# Patient Record
Sex: Male | Born: 1960 | Race: Black or African American | Hispanic: No | Marital: Single | Smoking: Never smoker
Health system: Southern US, Community
[De-identification: ages and names within clinical notes are randomized; demographics above are authoritative.]

## PROBLEM LIST (undated history)

## (undated) DIAGNOSIS — N189 Chronic kidney disease, unspecified: Secondary | ICD-10-CM

## (undated) DIAGNOSIS — I509 Heart failure, unspecified: Secondary | ICD-10-CM

## (undated) DIAGNOSIS — I10 Essential (primary) hypertension: Secondary | ICD-10-CM

## (undated) DIAGNOSIS — I639 Cerebral infarction, unspecified: Secondary | ICD-10-CM

## (undated) HISTORY — PX: CARDIAC DEFIBRILLATOR PLACEMENT: SHX171

---

## 2006-12-23 HISTORY — PX: ANKLE SURGERY: SHX546

## 2007-06-22 ENCOUNTER — Emergency Department (HOSPITAL_COMMUNITY): Admission: EM | Admit: 2007-06-22 | Discharge: 2007-06-22 | Payer: Self-pay | Admitting: Emergency Medicine

## 2015-12-02 ENCOUNTER — Encounter (HOSPITAL_BASED_OUTPATIENT_CLINIC_OR_DEPARTMENT_OTHER): Payer: Self-pay | Admitting: *Deleted

## 2015-12-02 ENCOUNTER — Emergency Department (HOSPITAL_BASED_OUTPATIENT_CLINIC_OR_DEPARTMENT_OTHER): Payer: Non-veteran care

## 2015-12-02 ENCOUNTER — Inpatient Hospital Stay (HOSPITAL_BASED_OUTPATIENT_CLINIC_OR_DEPARTMENT_OTHER)
Admission: EM | Admit: 2015-12-02 | Discharge: 2015-12-07 | DRG: 291 | Disposition: A | Discharge status: Home / Self Care | Payer: Non-veteran care | Attending: Internal Medicine | Admitting: Internal Medicine

## 2015-12-02 DIAGNOSIS — I130 Hypertensive heart and chronic kidney disease with heart failure and stage 1 through stage 4 chronic kidney disease, or unspecified chronic kidney disease: Secondary | ICD-10-CM | POA: Diagnosis not present

## 2015-12-02 DIAGNOSIS — I5043 Acute on chronic combined systolic (congestive) and diastolic (congestive) heart failure: Secondary | ICD-10-CM | POA: Diagnosis present

## 2015-12-02 DIAGNOSIS — N184 Chronic kidney disease, stage 4 (severe): Secondary | ICD-10-CM | POA: Diagnosis present

## 2015-12-02 DIAGNOSIS — E876 Hypokalemia: Secondary | ICD-10-CM | POA: Diagnosis present

## 2015-12-02 DIAGNOSIS — Z841 Family history of disorders of kidney and ureter: Secondary | ICD-10-CM

## 2015-12-02 DIAGNOSIS — R0600 Dyspnea, unspecified: Secondary | ICD-10-CM | POA: Insufficient documentation

## 2015-12-02 DIAGNOSIS — I10 Essential (primary) hypertension: Secondary | ICD-10-CM | POA: Diagnosis present

## 2015-12-02 DIAGNOSIS — I5021 Acute systolic (congestive) heart failure: Secondary | ICD-10-CM | POA: Diagnosis present

## 2015-12-02 DIAGNOSIS — N179 Acute kidney failure, unspecified: Secondary | ICD-10-CM | POA: Diagnosis present

## 2015-12-02 DIAGNOSIS — N19 Unspecified kidney failure: Secondary | ICD-10-CM

## 2015-12-02 DIAGNOSIS — D631 Anemia in chronic kidney disease: Secondary | ICD-10-CM | POA: Diagnosis present

## 2015-12-02 DIAGNOSIS — Z8249 Family history of ischemic heart disease and other diseases of the circulatory system: Secondary | ICD-10-CM

## 2015-12-02 DIAGNOSIS — I471 Supraventricular tachycardia: Secondary | ICD-10-CM | POA: Diagnosis present

## 2015-12-02 DIAGNOSIS — I509 Heart failure, unspecified: Secondary | ICD-10-CM

## 2015-12-02 DIAGNOSIS — I248 Other forms of acute ischemic heart disease: Secondary | ICD-10-CM | POA: Diagnosis present

## 2015-12-02 DIAGNOSIS — N189 Chronic kidney disease, unspecified: Secondary | ICD-10-CM

## 2015-12-02 DIAGNOSIS — Z8673 Personal history of transient ischemic attack (TIA), and cerebral infarction without residual deficits: Secondary | ICD-10-CM

## 2015-12-02 HISTORY — DX: Chronic kidney disease, unspecified: N18.9

## 2015-12-02 HISTORY — DX: Essential (primary) hypertension: I10

## 2015-12-02 HISTORY — DX: Heart failure, unspecified: I50.9

## 2015-12-02 HISTORY — DX: Cerebral infarction, unspecified: I63.9

## 2015-12-02 LAB — CBC WITH DIFFERENTIAL/PLATELET
BASOS PCT: 0 %
Basophils Absolute: 0 10*3/uL (ref 0.0–0.1)
EOS ABS: 0.1 10*3/uL (ref 0.0–0.7)
Eosinophils Relative: 2 %
HCT: 37.8 % — ABNORMAL LOW (ref 39.0–52.0)
Hemoglobin: 12.5 g/dL — ABNORMAL LOW (ref 13.0–17.0)
LYMPHS ABS: 2.3 10*3/uL (ref 0.7–4.0)
Lymphocytes Relative: 28 %
MCH: 27.8 pg (ref 26.0–34.0)
MCHC: 33.1 g/dL (ref 30.0–36.0)
MCV: 84.2 fL (ref 78.0–100.0)
MONO ABS: 0.7 10*3/uL (ref 0.1–1.0)
MONOS PCT: 8 %
Neutro Abs: 5.1 10*3/uL (ref 1.7–7.7)
Neutrophils Relative %: 62 %
Platelets: 231 10*3/uL (ref 150–400)
RBC: 4.49 MIL/uL (ref 4.22–5.81)
RDW: 15.2 % (ref 11.5–15.5)
WBC: 8.3 10*3/uL (ref 4.0–10.5)

## 2015-12-02 NOTE — ED Provider Notes (Signed)
CSN: [id number]     Arrival date & time 12/02/15  2320 History  By signing my name below, I, Freida BusmanDiana Omoyeni, attest that this documentation has been prepared under the direction and in the presence of Larry Knipp, MD . Electronically Signed: Freida Busmaniana Omoyeni, Scribe. 12/03/2015. 12:04 AM.    Chief Complaint  Patient presents with  . Shortness of Breath    Patient is a 54 y.o. male presenting with shortness of breath. The history is provided by the patient. No language interpreter was used.  Shortness of Breath Severity:  Moderate Onset quality:  Gradual Duration:  5 days Progression:  Worsening Chronicity:  Recurrent Relieved by:  Nothing Exacerbated by: position- supine. Associated symptoms: cough and wheezing   Associated symptoms: no chest pain, no fever and no vomiting     HPI Comments:  Edgar [name] is a 54 y.o. male with a history of CHF, who presents to the Emergency Department complaining of SOB for ~ 5 days, which worsened tonight. He reports associated dry cough, and wheezing. His SOB is exacerbated when supine. Pt admits to missing a few doses of labetalol and flomax recently. He denies smoking and use of diuretic medications. No alleviating factors noted. He has taken ASA today. He denies vomiting, fever, dysuria, urinary frequency/urgency, and swelling in his BLE.  [facility] Lucretia [facility]Wood[facility], last seen in September 2016   Past Medical History  Diagnosis Date  . CHF (congestive heart failure) (HCC)   . Stroke (HCC)   . Hypertension    Past Surgical History  Procedure Laterality Date  . Ankle surgery Right 2008   No family history on file. Social History  Substance Use Topics  . Smoking status: Never Smoker   . Smokeless tobacco: Never Used  . Alcohol Use: No    Review of Systems  Constitutional: Negative for fever.  Respiratory: Positive for cough, shortness of breath and wheezing. Negative for choking and stridor.   Cardiovascular: Negative for chest  pain, palpitations and leg swelling.  Gastrointestinal: Negative for vomiting.  Genitourinary: Negative for urgency and frequency.  All other systems reviewed and are negative.   Allergies  Review of patient's allergies indicates no known allergies.  Home Medications   Prior to Admission medications   Medication Sig Start Date End Date Taking? Authorizing Provider  labetalol (NORMODYNE) 300 MG tablet Take 300 mg by mouth 2 (two) times daily.   Yes Historical Provider, MD  tamsulosin (FLOMAX) 0.4 MG CAPS capsule Take 0.4 mg by mouth daily.   Yes Historical Provider, MD   BP 154/116 mmHg  Pulse 89  Temp(Src) 98 F (36.7 C) (Oral)  Resp 20  Ht 5\' 11"  (1.803 m)  Wt 238 lb (107.956 kg)  BMI 33.21 kg/m2  SpO2 94% Physical Exam  Constitutional: He is oriented to person, place, and time. He appears well-developed and well-nourished. No distress.  HENT:  Head: Normocephalic and atraumatic.  Mouth/Throat: Oropharynx is clear and moist. No oropharyngeal exudate.  Moist mucous membranes   Eyes: Conjunctivae are normal. Pupils are equal, round, and reactive to light.  Neck: Normal range of motion. Neck supple. No JVD present.  Trachea midline  Cardiovascular: Normal rate and regular rhythm.   Murmur heard. Pulses:      Dorsalis pedis pulses are 2+ on the right side, and 2+ on the left side.  Systolic 2/6 ejection murmur   Pulmonary/Chest: Effort normal. No stridor. No respiratory distress.  diminished bilaterally   Abdominal: Soft. Bowel sounds are  normal. He exhibits no distension and no mass. There is no tenderness. There is no rebound and no guarding.  Musculoskeletal: Normal range of motion. He exhibits no edema or tenderness.  All compartments are soft   Neurological: He is alert and oriented to person, place, and time. He has normal reflexes.  Skin: Skin is warm and dry. He is not diaphoretic.  Psychiatric: He has a normal mood and affect. His behavior is normal.  Nursing note  and vitals reviewed.   ED Course  Procedures  DIAGNOSTIC STUDIES:  Oxygen Saturation is 94% on RA, adequate by my interpretation.    COORDINATION OF CARE:  12:04 AM Discussed treatment plan with pt at bedside and pt agreed to plan.  Labs Review Labs Reviewed  CBC WITH DIFFERENTIAL/PLATELET - Abnormal; Notable for the following:    Hemoglobin 12.5 (*)    HCT 37.8 (*)    All other components within normal limits  BASIC METABOLIC PANEL - Abnormal; Notable for the following:    Glucose, Bld 112 (*)    BUN 32 (*)    Creatinine, Ser 3.07 (*)    Calcium 8.6 (*)    GFR calc non Af Amer 21 (*)    GFR calc Af Amer 25 (*)    All other components within normal limits  BRAIN NATRIURETIC PEPTIDE - Abnormal; Notable for the following:    B Natriuretic Peptide 1236.3 (*)    All other components within normal limits  TROPONIN I - Abnormal; Notable for the following:    Troponin I 0.18 (*)    All other components within normal limits    Imaging Review Dg Chest 2 View  12/03/2015  CLINICAL DATA:  Acute onset of shortness of breath, worse when lying down. Initial encounter. EXAM: CHEST  2 VIEW COMPARISON:  None. FINDINGS: The lungs are well-aerated. Vascular congestion is noted. There is no evidence of focal opacification, pleural effusion or pneumothorax. The heart is borderline normal in size. No acute osseous abnormalities are seen. IMPRESSION: Vascular congestion noted; lungs remain grossly clear. Electronically Signed   By: Roanna Raider M.D.   On: 12/03/2015 00:09   I have personally reviewed and evaluated these images and lab results as part of my medical decision-making.   EKG Interpretation   Date/Time:  Saturday December 02 2015 23:43:08 EST Ventricular Rate:  89 PR Interval:  142 QRS Duration: 112 QT Interval:  386 QTC Calculation: 469 R Axis:   87 Text Interpretation:  Sinus rhythm with occasional Premature ventricular  complexes Possible Left atrial enlargement  Nonspecific T wave abnormality  Confirmed by [facility]  MD, Ashly Yepez ([id number]) on 12/02/2015 11:47:08 PM      MDM   Final diagnoses:  None   Medications  amLODipine (NORVASC) tablet 5 mg (not administered)  levalbuterol (XOPENEX) nebulizer solution 0.63 mg (0.63 mg Nebulization Given 12/03/15 0043)    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 Antionette Char hold anticoagulation at this time admit to tele inpt   I personally performed the services described in this documentation, which was scribed in my presence. The recorded information has been reviewed and is accurate.      Cy Blamer, MD 12/03/15 [phone]

## 2015-12-02 NOTE — ED Notes (Signed)
Pt reports he's been feeling short of breath since Tuesday (hx of CHF; denies asthma or COPD dx); pt reports it worsens with lying down. Denies fever, chest pain, n/v/d; reports cough.

## 2015-12-03 ENCOUNTER — Other Ambulatory Visit (HOSPITAL_COMMUNITY): Payer: Non-veteran care

## 2015-12-03 ENCOUNTER — Encounter (HOSPITAL_BASED_OUTPATIENT_CLINIC_OR_DEPARTMENT_OTHER): Payer: Self-pay | Admitting: Emergency Medicine

## 2015-12-03 ENCOUNTER — Inpatient Hospital Stay (HOSPITAL_COMMUNITY): Payer: Non-veteran care

## 2015-12-03 DIAGNOSIS — I509 Heart failure, unspecified: Secondary | ICD-10-CM | POA: Diagnosis not present

## 2015-12-03 DIAGNOSIS — Z8673 Personal history of transient ischemic attack (TIA), and cerebral infarction without residual deficits: Secondary | ICD-10-CM | POA: Diagnosis not present

## 2015-12-03 DIAGNOSIS — I130 Hypertensive heart and chronic kidney disease with heart failure and stage 1 through stage 4 chronic kidney disease, or unspecified chronic kidney disease: Secondary | ICD-10-CM | POA: Diagnosis present

## 2015-12-03 DIAGNOSIS — I5043 Acute on chronic combined systolic (congestive) and diastolic (congestive) heart failure: Secondary | ICD-10-CM | POA: Diagnosis present

## 2015-12-03 DIAGNOSIS — Z8249 Family history of ischemic heart disease and other diseases of the circulatory system: Secondary | ICD-10-CM | POA: Diagnosis not present

## 2015-12-03 DIAGNOSIS — I5041 Acute combined systolic (congestive) and diastolic (congestive) heart failure: Secondary | ICD-10-CM | POA: Diagnosis not present

## 2015-12-03 DIAGNOSIS — N189 Chronic kidney disease, unspecified: Secondary | ICD-10-CM | POA: Diagnosis not present

## 2015-12-03 DIAGNOSIS — N179 Acute kidney failure, unspecified: Secondary | ICD-10-CM | POA: Diagnosis present

## 2015-12-03 DIAGNOSIS — Z841 Family history of disorders of kidney and ureter: Secondary | ICD-10-CM | POA: Diagnosis not present

## 2015-12-03 DIAGNOSIS — R0600 Dyspnea, unspecified: Secondary | ICD-10-CM | POA: Diagnosis present

## 2015-12-03 DIAGNOSIS — I10 Essential (primary) hypertension: Secondary | ICD-10-CM | POA: Diagnosis not present

## 2015-12-03 DIAGNOSIS — E876 Hypokalemia: Secondary | ICD-10-CM | POA: Diagnosis present

## 2015-12-03 DIAGNOSIS — N184 Chronic kidney disease, stage 4 (severe): Secondary | ICD-10-CM | POA: Diagnosis present

## 2015-12-03 DIAGNOSIS — I5021 Acute systolic (congestive) heart failure: Secondary | ICD-10-CM | POA: Diagnosis not present

## 2015-12-03 DIAGNOSIS — D631 Anemia in chronic kidney disease: Secondary | ICD-10-CM | POA: Diagnosis present

## 2015-12-03 DIAGNOSIS — I471 Supraventricular tachycardia: Secondary | ICD-10-CM | POA: Diagnosis present

## 2015-12-03 DIAGNOSIS — I248 Other forms of acute ischemic heart disease: Secondary | ICD-10-CM | POA: Diagnosis present

## 2015-12-03 LAB — URINALYSIS, ROUTINE W REFLEX MICROSCOPIC
BILIRUBIN URINE: NEGATIVE
Glucose, UA: NEGATIVE mg/dL
KETONES UR: NEGATIVE mg/dL
Leukocytes, UA: NEGATIVE
NITRITE: NEGATIVE
Protein, ur: 100 mg/dL — AB
Specific Gravity, Urine: 1.023 (ref 1.005–1.030)
pH: 5.5 (ref 5.0–8.0)

## 2015-12-03 LAB — BASIC METABOLIC PANEL
Anion gap: 7 (ref 5–15)
BUN: 32 mg/dL — AB (ref 6–20)
CALCIUM: 8.6 mg/dL — AB (ref 8.9–10.3)
CO2: 23 mmol/L (ref 22–32)
CREATININE: 3.07 mg/dL — AB (ref 0.61–1.24)
Chloride: 108 mmol/L (ref 101–111)
GFR calc non Af Amer: 21 mL/min — ABNORMAL LOW (ref >60)
GFR, EST AFRICAN AMERICAN: 25 mL/min — AB (ref >60)
Glucose, Bld: 112 mg/dL — ABNORMAL HIGH (ref 65–99)
Potassium: 4 mmol/L (ref 3.5–5.1)
SODIUM: 138 mmol/L (ref 135–145)

## 2015-12-03 LAB — TROPONIN I
Troponin I: 0.12 ng/mL — ABNORMAL HIGH
Troponin I: 0.13 ng/mL — ABNORMAL HIGH (ref <0.031)
Troponin I: 0.13 ng/mL — ABNORMAL HIGH (ref <0.031)
Troponin I: 0.18 ng/mL — ABNORMAL HIGH (ref <0.031)

## 2015-12-03 LAB — URINE MICROSCOPIC-ADD ON

## 2015-12-03 LAB — CBC
HCT: 36.2 % — ABNORMAL LOW (ref 39.0–52.0)
Hemoglobin: 12.3 g/dL — ABNORMAL LOW (ref 13.0–17.0)
MCH: 28.6 pg (ref 26.0–34.0)
MCHC: 34 g/dL (ref 30.0–36.0)
MCV: 84.2 fL (ref 78.0–100.0)
Platelets: 234 10*3/uL (ref 150–400)
RBC: 4.3 MIL/uL (ref 4.22–5.81)
RDW: 15.4 % (ref 11.5–15.5)
WBC: 8.6 10*3/uL (ref 4.0–10.5)

## 2015-12-03 LAB — MAGNESIUM: Magnesium: 2 mg/dL (ref 1.7–2.4)

## 2015-12-03 LAB — CREATININE, SERUM
Creatinine, Ser: 3.07 mg/dL — ABNORMAL HIGH (ref 0.61–1.24)
GFR calc Af Amer: 25 mL/min — ABNORMAL LOW
GFR calc non Af Amer: 21 mL/min — ABNORMAL LOW

## 2015-12-03 LAB — TSH: TSH: 0.995 u[IU]/mL (ref 0.350–4.500)

## 2015-12-03 LAB — BRAIN NATRIURETIC PEPTIDE: B Natriuretic Peptide: 1236.3 pg/mL — ABNORMAL HIGH (ref 0.0–100.0)

## 2015-12-03 LAB — CREATININE, URINE, RANDOM: Creatinine, Urine: 270.64 mg/dL

## 2015-12-03 LAB — D-DIMER, QUANTITATIVE (NOT AT ARMC): D DIMER QUANT: 0.57 ug{FEU}/mL — AB (ref 0.00–0.50)

## 2015-12-03 LAB — SODIUM, URINE, RANDOM: Sodium, Ur: 54 mmol/L

## 2015-12-03 MED ORDER — ENOXAPARIN SODIUM 30 MG/0.3ML ~~LOC~~ SOLN
30.0000 mg | Freq: Every day | SUBCUTANEOUS | Status: DC
  Filled 2015-12-03: qty 0.3

## 2015-12-03 MED ORDER — HYDRALAZINE HCL 20 MG/ML IJ SOLN
10.0000 mg | Freq: Four times a day (QID) | INTRAMUSCULAR | Status: DC | PRN
  Administered 2015-12-03 – 2015-12-05 (×3): 10 mg via INTRAVENOUS
  Filled 2015-12-03 (×3): qty 1

## 2015-12-03 MED ORDER — ALBUTEROL SULFATE (2.5 MG/3ML) 0.083% IN NEBU: 2.5000 mg | INHALATION_SOLUTION | Freq: Once | RESPIRATORY_TRACT | Status: DC

## 2015-12-03 MED ORDER — AMLODIPINE BESYLATE 5 MG PO TABS
5.0000 mg | ORAL_TABLET | Freq: Once | ORAL | Status: AC
  Administered 2015-12-03: 5 mg via ORAL
  Filled 2015-12-03: qty 1

## 2015-12-03 MED ORDER — FUROSEMIDE 10 MG/ML IJ SOLN
60.0000 mg | Freq: Two times a day (BID) | INTRAMUSCULAR | Status: DC
  Administered 2015-12-03 – 2015-12-06 (×7): 60 mg via INTRAVENOUS
  Filled 2015-12-03 (×6): qty 6

## 2015-12-03 MED ORDER — ASPIRIN EC 81 MG PO TBEC
81.0000 mg | DELAYED_RELEASE_TABLET | Freq: Every day | ORAL | Status: DC
  Administered 2015-12-03 – 2015-12-07 (×5): 81 mg via ORAL
  Filled 2015-12-03 (×5): qty 1

## 2015-12-03 MED ORDER — TAMSULOSIN HCL 0.4 MG PO CAPS
0.4000 mg | ORAL_CAPSULE | Freq: Every day | ORAL | Status: DC
  Administered 2015-12-03 – 2015-12-07 (×5): 0.4 mg via ORAL
  Filled 2015-12-03 (×5): qty 1

## 2015-12-03 MED ORDER — HYDRALAZINE HCL 20 MG/ML IJ SOLN
5.0000 mg | Freq: Four times a day (QID) | INTRAMUSCULAR | Status: DC | PRN
  Administered 2015-12-03: 5 mg via INTRAVENOUS
  Filled 2015-12-03: qty 1

## 2015-12-03 MED ORDER — AMLODIPINE BESYLATE 10 MG PO TABS
10.0000 mg | ORAL_TABLET | Freq: Every day | ORAL | Status: DC
  Administered 2015-12-04 – 2015-12-05 (×2): 10 mg via ORAL
  Filled 2015-12-03 (×2): qty 1

## 2015-12-03 MED ORDER — LEVALBUTEROL HCL 0.63 MG/3ML IN NEBU
0.6300 mg | INHALATION_SOLUTION | Freq: Once | RESPIRATORY_TRACT | Status: AC
  Administered 2015-12-03: 0.63 mg via RESPIRATORY_TRACT
  Filled 2015-12-03: qty 3

## 2015-12-03 NOTE — Consult Note (Signed)
Consulting cardiologist: Dr Dina RichJonathan Branch MD  Clinical Summary [name] is a 54 y.o.male  History of HTN, CVA, CKD III, and reportedly history of systolic heart failure admitted with 5 days of progressing DOE and orthopnea. Denies any LE edema, no chest pain  ER vitals: bp 154/116 p 89 Wt 238 lbs K 4, Cr 3, BUN 32, Hgb 12.5, Plt 231, BNP 1236, trop 0.18-->0.13, D-dimer 0.57, TSH 0.995 CXR vasular congestion EKG SR, LAE, nonspecific ST/T changes Renal US: small kidneys   No Known Allergies  Medications Scheduled Medications: . aspirin EC  81 mg Oral Daily  . enoxaparin (LOVENOX) injection  30 mg Subcutaneous Daily  . tamsulosin  0.4 mg Oral Daily     Infusions:     PRN Medications:  hydrALAZINE   Past Medical History  Diagnosis Date  . CHF (congestive heart failure) (HCC)     Years ago, treated and no recurrence  . Stroke [facility](HCC)     2014, residual R sided deficits  . Hypertension   . CKD (chronic kidney disease)     Past Surgical History  Procedure Laterality Date  . Ankle surgery Right 2008    Family History  Problem Relation Age of Onset  . Congestive Heart Failure Mother   . Hypertension Mother   . Arrhythmia Father     has Pacemaker or ICD  . Kidney disease Mother     Social History [name] reports that he has never smoked. He has never used smokeless tobacco. [name] reports that he does not drink alcohol.  Review of Systems CONSTITUTIONAL: No weight loss, fever, chills, weakness or fatigue.  HEENT: Eyes: No visual loss, blurred vision, double vision or yellow sclerae. No hearing loss, sneezing, congestion, runny nose or sore throat.  SKIN: No rash or itching.  CARDIOVASCULAR: per hpi  RESPIRATORY: + SOB  GASTROINTESTINAL: No anorexia, nausea, vomiting or diarrhea. No abdominal pain or blood.  GENITOURINARY: no polyuria, no dysuria NEUROLOGICAL: No headache, dizziness, syncope, paralysis, ataxia, numbness or tingling in the  extremities. No change in bowel or bladder control.  MUSCULOSKELETAL: No muscle, back pain, joint pain or stiffness.  HEMATOLOGIC: No anemia, bleeding or bruising.  LYMPHATICS: No enlarged nodes. No history of splenectomy.  PSYCHIATRIC: No history of depression or anxiety.      Physical Examination Blood pressure 177/118, pulse 89, temperature 98 F (36.7 C), temperature source Oral, resp. rate 18, height 6' (1.829 m), weight 228 lb 3.2 oz (103.511 kg), SpO2 100 %.  Intake/Output Summary (Last 24 hours) at 12/03/15 1126 Last data filed at 12/03/15 0958  Gross per 24 hour  Intake    460 ml  Output    150 ml  Net    310 ml    HEENT: sclera clear  Cardiovascular: RRR, no m/r/g, JVD to angle of jaw  Respiratory: CTAB  GI: abdomen soft, NT, ND  MSK: no LE edema  Neuro: no focal deficits  Psych: no focal deficits   Lab Results  Basic Metabolic Panel:  Recent Labs Lab 12/02/15 2340 12/03/15 0527  NA 138  --   K 4.0  --   CL 108  --   CO2 23  --   GLUCOSE 112*  --   BUN 32*  --   CREATININE 3.07* 3.07*  CALCIUM 8.6*  --   MG  --  2.0    Liver Function Tests: No results for input(s): AST, ALT, ALKPHOS, BILITOT, PROT, ALBUMIN in the last 168  hours.  CBC:  Recent Labs Lab 12/02/15 2340 12/03/15 0527  WBC 8.3 8.6  NEUTROABS 5.1  --   HGB 12.5* 12.3*  HCT 37.8* 36.2*  MCV 84.2 84.2  PLT 231 234    Cardiac Enzymes:  Recent Labs Lab 12/02/15 2340 12/03/15 0527  TROPONINI 0.18* 0.13*    BNP: Invalid input(s): POCBNP    Impression/ Recommendations  1. Acute CHF, unspecified type - presents with clinical CHF, echo pending to further define the etiology. Severely hypertensive on presentation, increased risk for HTN cardiomyopathy.  - elevated Cr to 3. Looking back Cr 1.8 to 2.5 back in 2008, in 08/2015 of this year he was 2.5. Overall he has significant CKD and does not appear that far from his baseline. There may be some component of venous  congestion and CHF contributing to his poor renal function - would start lasix  IV bid and follow renal function - f/u echo results  2. HTN - elevated bp's, will start norvasc  daily. Bp may downtrend with diuresis. Pending echo may need medications for systolic dysfunction (ie beta blockers, hydral/nitrates). Norvasc is reasonable option until more data is available.   3. Troponin elevation - mild, trending down. Nonspecific finding in setting of severely elevated bp's, renal diseae, clinical CHF. - f/u echo results     Dina Rich, M.D.

## 2015-12-03 NOTE — H&P (Signed)
History and Physical  Patient Name: Edgar [name]     WUJ:[id number]    DOB: 12/29/1960    DOA: 12/02/2015 Referring physician: April Palumbo, MD [facility]     Chief Complaint: Dyspnea on exertion and orthopnea  HPI: Edgar [name] is a 54 y.o. 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  The patient reports feeling well until about 5 days ago when he started to develop dyspnea with exertion, dyspnea with lying down relieved with sitting up, dry cough and frequent nocturia.  This was in the absence of chest discomfort/pressure, weight gain, or leg swelling.  Today, the symptoms were so bad he came to the ER.  Of note, per CareEverywhere, the patient was seen in [facility] three months ago for dyspnea, was recommended for admission at that time for CHF and refused.   In the ED tonight, the patient had acute on chronic renal failure (Cr 3.07 mg/dL from 9.562.54 mg/dL [facility] minimally elevated troponin and d-dimer, no leukocytosis, and BNP 1236 pg/mL.  A chest x-ray showed pulmonary edema, but the exam was not impressive for fluid overload.  TRH were asked to admit for presumed CHF and AoCKI.     Review of Systems:  Pt complains of paroxysmal nocturnal dyspnea, dyspnea on exertion, dry cough, nocturia. Pt denies any leg swelling, abdominal swelling, weight gain, chest discomfort, fever, sputum.  All other systems negative except as just noted or noted in the history of present illness.  No Known Allergies  Prior to Admission medications   Medication Sig Start Date End Date Taking? Authorizing Provider  labetalol (NORMODYNE) 300 MG tablet Take 300 mg by mouth 2 (two) times daily.   Yes Historical Provider, MD  tamsulosin (FLOMAX) 0.4 MG CAPS capsule Take 0.4 mg by  mouth daily.   Yes Historical Provider, MD    Past Medical History  Diagnosis Date  . CHF (congestive heart failure) (HCC)     Years ago, treated and no recurrence  . Stroke [facility](HCC)     2014, residual R sided deficits  . Hypertension   . CKD (chronic kidney disease)     Past Surgical History  Procedure Laterality Date  . Ankle surgery Right 2008    Family history: family history includes Arrhythmia in his father; Congestive Heart Failure in his mother; Hypertension in his mother; Kidney disease in his mother.  Social History: [place]. 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 He does not smoke. He ambulates without assistance.       Physical Exam: BP 172/125 mmHg  Pulse 89  Temp(Src) 98 F (36.7 C) (Oral)  Resp 18  Ht 6' (1.829 m)  Wt 103.511 kg (228 lb 3.2 oz)  BMI 30.94 kg/m2  SpO2 100% General appearance: Well-developed, adult male, alert and tired.   Eyes: Anicteric, conjunctiva pink, lids and lashes normal.     ENT: No nasal deformity, discharge, or epistaxis.  OP moist without lesions.   Lymph: No cervical lymphadenopathy. Skin: Warm and dry.   Cardiac: RRR, nl S1-S2, soft systolic murmur.  EJs are visible but not IJ.  No LE edema at all.  Respiratory: Normal respiratory rate and rhythm.  CTAB without rales. Abdomen: Abdomen  soft without rigidity.  No TTP. No ascites, distension.   MSK: No deformities or effusions. Neuro: Sensorium intact and responding to questions, attention normal.  Speech is fluent.  Moves all extremities equally and with normal coordination.   Cranial nerves normal. Psych: Behavior appropriate.  Affect flat.  No evidence of aural or visual hallucinations or delusions.       Labs on Admission:  The metabolic panel shows normal sodium, potassium, bicarbonate. The BUN is normal but the serum creatinine is 2.07 mg/dL. The last previous known creatinine and sclerae  everywhere is 2.54 mg/dL in September. The troponin is 0.18 ng per mL. The BNP is 1236 pg per mL, markedly elevated. The d-dimer is mentally elevated The complete blood count shows no leukocytosis, anemia of her Delorise Shiner, thrombocytopenia.   Radiological Exams on Admission: Personally reviewed: Dg Chest 2 View 12/03/2015   Pulmonary congestion/edema   EKG: Independently reviewed. Sinus, rate 89.  Lateral and inferior TWI.  No previous for comparison.    Assessment/Plan 1. Dyspnea likely from acute CHF:  No wheezing/smoking to suggest COPD.  No chest pain to suggest PE or ACS.  Chest x-ray suggests some edema, not auscultated.  BNP markedly high.  Unclear systolic or diastolic at this time.  Extremities are warm and dry.     -Echocardiogram -Consult to Cardiology -Will hold diuresis at least until echocardiogram, given that I do not find him markedly fluid overloaded on exam and his renal function has worsened since September. -Hydralazine PRN for BP  -Check magnesium and TSH   2. HTN and hx of CVA:  Hypertensive at admission.  Contributing to #1 above.   -Hold further amlodipine until echocardiogram -Hydralazine 5 mg IV prn for now -Continue aspirin -Restart statin  3. Elevated troponin:  Likely demand.  Possibly resolving MI. -Serial troponin -Echocardiogram  4. Elevated d-dimer:  Value is within the range of normal given this patients reduced creatinine clearance, and his symptoms of positional dyspnea and DOE are better explained by CHF.  5. CKD stage III with superimposed AKI:  Poor forward flow from decompensated systolic CHF versus congestive nephropathy. -Diuresis per Cardiology -Urinalysis and urine electrolytes -Renal US       DVT PPx: Lovenox, renally dosed Diet: Cardiac Consultants: Cardiology Code Status: Full Family Communication: None  Medical decision making: What exists of the patient's previous chart in CareEverywhere was reviewed in  depth. Patient seen 4:42 AM on 12/03/2015.  Disposition Plan:  Admit for CHF.  Echocardiogram and Cardiology consultation.  Renal studies and trend serum creatiine.  Anticiapte 3-4 days hospitalization.      Edgar [name] [facility] Pager [phone]

## 2015-12-03 NOTE — Progress Notes (Signed)
Chart reviewed. Patient examined. Admitted after midnight. Seen by cardiology. Echocardiogram pending. Diuresing well.  Crista Curborinna Deva Ron, MD [facility]

## 2015-12-04 ENCOUNTER — Other Ambulatory Visit (HOSPITAL_COMMUNITY): Payer: Non-veteran care

## 2015-12-04 ENCOUNTER — Inpatient Hospital Stay (HOSPITAL_COMMUNITY): Payer: Non-veteran care

## 2015-12-04 DIAGNOSIS — I509 Heart failure, unspecified: Secondary | ICD-10-CM

## 2015-12-04 DIAGNOSIS — I10 Essential (primary) hypertension: Secondary | ICD-10-CM

## 2015-12-04 DIAGNOSIS — N189 Chronic kidney disease, unspecified: Secondary | ICD-10-CM

## 2015-12-04 DIAGNOSIS — I5021 Acute systolic (congestive) heart failure: Secondary | ICD-10-CM

## 2015-12-04 DIAGNOSIS — N179 Acute kidney failure, unspecified: Secondary | ICD-10-CM

## 2015-12-04 LAB — BASIC METABOLIC PANEL
ANION GAP: 11 (ref 5–15)
BUN: 29 mg/dL — AB (ref 6–20)
CHLORIDE: 103 mmol/L (ref 101–111)
CO2: 25 mmol/L (ref 22–32)
Calcium: 9.2 mg/dL (ref 8.9–10.3)
Creatinine, Ser: 3.06 mg/dL — ABNORMAL HIGH (ref 0.61–1.24)
GFR, EST AFRICAN AMERICAN: 25 mL/min — AB (ref >60)
GFR, EST NON AFRICAN AMERICAN: 21 mL/min — AB (ref >60)
Glucose, Bld: 91 mg/dL (ref 65–99)
POTASSIUM: 3.3 mmol/L — AB (ref 3.5–5.1)
SODIUM: 139 mmol/L (ref 135–145)

## 2015-12-04 MED ORDER — POTASSIUM CHLORIDE CRYS ER 20 MEQ PO TBCR
40.0000 meq | EXTENDED_RELEASE_TABLET | Freq: Every day | ORAL | Status: DC
  Administered 2015-12-04 – 2015-12-06 (×3): 40 meq via ORAL
  Filled 2015-12-04 (×3): qty 2

## 2015-12-04 MED ORDER — ENOXAPARIN SODIUM 40 MG/0.4ML ~~LOC~~ SOLN: 40.0000 mg | SUBCUTANEOUS | Status: DC

## 2015-12-04 MED ORDER — POTASSIUM CHLORIDE CRYS ER 20 MEQ PO TBCR: 40.0000 meq | EXTENDED_RELEASE_TABLET | Freq: Two times a day (BID) | ORAL | Status: DC

## 2015-12-04 NOTE — Progress Notes (Signed)
Utilization review completed. Tacuma Graffam, RN, BSN. 

## 2015-12-04 NOTE — Care Management Note (Signed)
Case Management Note  Patient Details  Name: Edgar [name] MRN: [id number] Date of Birth: 10/18/1961  Subjective/Objective:         CHF           Action/Plan: NCM spoke to pt and states [facility]. [facility] and PCP is Dr. Joseph [name]. States he takes medications as prescribed and has no difficulty with picking up his meds.   Expected Discharge Date:  12/05/2015              Expected Discharge Plan:  Home/Self Care  In-House Referral:  NA  Discharge planning Services  CM Consult  Post Acute Care Choice:  NA Choice offered to:  NA  DME Arranged:  N/A DME Agency:  NA  HH Arranged:  NA HH Agency:  NA  Status of Service:  Completed, signed off  Medicare Important Message Given:    Date Medicare IM Given:    Medicare IM give by:    Date Additional Medicare IM Given:    Additional Medicare Important Message give by:     If discussed at Long Length of Stay Meetings, dates discussed:    Additional Comments:  Edgar [name], Edgar Festa Ellen, RN 12/04/2015, 5:08 PM

## 2015-12-04 NOTE — Progress Notes (Signed)
Patient Name: Edgar [name] Date of Encounter: 12/04/2015     Principal Problem:   Acute systolic congestive heart failure (HCC) Active Problems:   Hypertension   Acute kidney injury superimposed on chronic kidney disease (HCC)   Anemia in chronic renal disease   Acute congestive heart failure (HCC)   Acute heart failure (HCC)    SUBJECTIVE  Feeling much better than yesterday in terms of breathing. No LE edema, orthopnea or PND. Not quite back to baseline. Put out tons of urine. He does not take lasix at home. He does take labetolol but has forgotten some doses recently.   CURRENT MEDS . amLODipine  10 mg Oral Daily  . aspirin EC  81 mg Oral Daily  . enoxaparin (LOVENOX) injection  30 mg Subcutaneous Daily  . furosemide  60 mg Intravenous BID  . potassium chloride  40 mEq Oral Daily  . tamsulosin  0.4 mg Oral Daily    OBJECTIVE  Filed Vitals:   12/03/15 1643 12/03/15 1730 12/03/15 2157 12/04/15 0412  BP: 174/123 146/73 146/95 163/87  Pulse: 92 99 88 88  Temp:   98.7 F (37.1 C) 98.5 F (36.9 C)  TempSrc:   Oral Oral  Resp:   18 20  Height:      Weight:    213 lb 1.6 oz (96.662 kg)  SpO2:   100% 97%    Intake/Output Summary (Last 24 hours) at 12/04/15 0853 Last data filed at 12/04/15 0600  Gross per 24 hour  Intake   1040 ml  Output   1375 ml  Net   -335 ml   Filed Weights   12/02/15 2326 12/03/15 0346 12/04/15 0412  Weight: 238 lb (107.956 kg) 228 lb 3.2 oz (103.511 kg) 213 lb 1.6 oz (96.662 kg)    PHYSICAL EXAM  General: Pleasant, NAD. Neuro: Alert and oriented X 3. Moves all extremities spontaneously. Psych: Normal affect. HEENT:  Normal  Neck: Supple without bruits or JVD. Lungs:  Resp regular and unlabored, CTA. Heart: RRR no s3, s4, or murmurs. Abdomen: Soft, non-tender, non-distended, BS + x 4.  Extremities: No clubbing, cyanosis or edema. DP/PT/Radials 2+ and equal bilaterally.  Accessory Clinical Findings  CBC  Recent Labs  12/02/15 2340 12/03/15 0527  WBC 8.3 8.6  NEUTROABS 5.1  --   HGB 12.5* 12.3*  HCT 37.8* 36.2*  MCV 84.2 84.2  PLT 231 234   Basic Metabolic Panel  Recent Labs  12/02/15 2340 12/03/15 0527 12/04/15 0418  NA 138  --  139  K 4.0  --  3.3*  CL 108  --  103  CO2 23  --  25  GLUCOSE 112*  --  91  BUN 32*  --  29*  CREATININE 3.07* 3.07* 3.06*  CALCIUM 8.6*  --  9.2  MG  --  2.0  --    Liver Function Tests No results for input(s): AST, ALT, ALKPHOS, BILITOT, PROT, ALBUMIN in the last 72 hours. No results for input(s): LIPASE, AMYLASE in the last 72 hours. Cardiac Enzymes  Recent Labs  12/03/15 0527 12/03/15 1034 12/03/15 1715  TROPONINI 0.13* 0.13* 0.12*   BNP Invalid input(s): POCBNP D-Dimer  Recent Labs  12/02/15 2340  DDIMER 0.57*    Thyroid Function Tests  Recent Labs  12/03/15 0527  TSH 0.995    TELE  NSR with PVCs and runs of AIVR .  Radiology/Studies  Dg Chest 2 View  12/03/2015  CLINICAL DATA:  Acute onset of shortness  of breath, worse when lying down. Initial encounter. EXAM: CHEST  2 VIEW COMPARISON:  None. FINDINGS: The lungs are well-aerated. Vascular congestion is noted. There is no evidence of focal opacification, pleural effusion or pneumothorax. The heart is borderline normal in size. No acute osseous abnormalities are seen. IMPRESSION: Vascular congestion noted; lungs remain grossly clear. Electronically Signed   By: Roanna RaiderJeffery  Chang M.D.   On: 12/03/2015 00:09   [place] Renal  12/03/2015  CLINICAL DATA:  Renal failure EXAM: RENAL / URINARY TRACT ULTRASOUND COMPLETE COMPARISON:  None. FINDINGS: Right Kidney: Length: 9.3 cm. Echogenicity and renal cortical thickness are within normal limits. No mass, perinephric fluid, or hydronephrosis visualized. No sonographically demonstrable calculus or ureterectasis. Left Kidney: Length: 9.0 cm. Echogenicity and renal cortical thickness are within normal limits. No perinephric fluid or hydronephrosis  visualized. There is a cyst in the mid right kidney measuring 1.0 x 1.0 x 0.9 cm. There is no sonographically demonstrable calculus or ureterectasis. Bladder: Appears normal for degree of bladder distention. IMPRESSION: Small left renal cyst. No obstructing foci in either kidney. Renal cortical thickness and echogenicity are within normal limits bilaterally. Kidneys are rather small, a finding that may be seen with medical renal disease. Electronically Signed   By: Bretta BangWilliam  Woodruff III M.D.   On: 12/03/2015 10:22    ASSESSMENT AND PLAN Mr. [name] is a 54 y.o.malewith a history of HTN, CVA, CKD III, and reported history of heart failure (2000)  admitted on 12/02/15 with 5 days of progressing DOE and orthopnea.   Acute CHF, unspecified type  - presented with clinical CHF, echo pending to further define the etiology. Severely hypertensive on presentation, increased risk for HTN cardiomyopathy.  - started on lasix 60mg  IV bid, renal function stable. Net neg 485 ml. However, I think I/Os incorrect as patient is breathing much better and says he has put out quite a bit of urine. Weights also appear inaccurate but does look like he is down. Continue this dosing  - f/u echo results  CKD: elevated Cr to 3. Looking back Cr 1.8 to 2.5 back in 2008, in 08/2015 of this year he was 2.5. Overall he has significant CKD and does not appear that far from his baseline. There may be some component of venous congestion and CHF contributing to his poor renal function.  HTN - elevated bp's. Started on norvasc 10mg  daily. BP may downtrend with diuresis. Still moderately elevated. Pending echo may need medications for systolic dysfunction (ie beta blockers, hydral/nitrates). Norvasc is reasonable option until more data is available.   Troponin elevation - mild, trending down (0.13--> 0.12). Nonspecific finding in setting of severely elevated bp's, renal diseae, clinical CHF. No CP - f/u echo results   AIVR- noted on  tele. He alternates between NSR and AIVR   Signed, Janetta HoraHOMPSON, KATHRYN R PA-C  Pager [phone]  I have examined the patient and reviewed assessment and plan and discussed with patient.  Agree with above as stated.  Echo pending. BP better but still high.  Diuresing.  Breathing not back to baseline.  CRI-watch closely.  No plan for cath at this time despite troponin.  Likely related to heart failure.  VARANASI,JAYADEEP S.

## 2015-12-04 NOTE — Progress Notes (Signed)
[facility] PROGRESS NOTE  Erby PianChristopher Vipond ZOX:[id number] DOB: 11/24/1961 DOA: 12/02/2015 PCP: Pcp Not In System  Summary 54 y.o. 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  The patient reports feeling well until about 5 days ago when he started to develop dyspnea with exertion, dyspnea with lying down relieved with sitting up, dry cough and frequent nocturia. This was in the absence of chest discomfort/pressure, weight gain, or leg swelling. Today, the symptoms were so bad he came to the ER. Of note, per [facility]Everywhere, the patient was seen in [facility] three months ago for dyspnea, was recommended for admission at that time for CHF and refused.   In the ED tonight, the patient had acute on chronic renal failure (Cr 3.07 mg/dL [facility] 8.112.54 mg[facility] minimally elevated troponin and d-dimer, no leukocytosis, and BNP 1236 pg/mL. A chest x-ray showed pulmonary edema, but the exam was not impressive for fluid overload. TR[facility] were asked to admit for presumed CHF and AoCKI.   Assessment/Plan:  Principal Problem:   Acute congestive heart failure (HCC): echo pending. Continue IV lasix per cardiology Active Problems:  uncontrolled Hypertension despite amlodipine. Awaiting echo to titrate medications. Continue prn coverage   CKD 4: with progression of CKD v AKI: monitoring. No ACE or ARB   Anemia in chronic renal disease   Acute heart failure (HCC) Hypokalemia: replete  Code Status:  full Family Communication:  friend Disposition Plan:  Home when stable  Consultants:  [facility]  Procedures:     Antibiotics:    HPI/Subjective: Wants to go home, but agrees to stay overnight  Objective: Filed Vitals:   12/04/15 0412 12/04/15 1417  BP: 163/87 159/93  Pulse: 88  85  Temp: 98.5 F (36.9 C) 98.3 F (36.8 C)  Resp: 20 18    Intake/Output Summary (Last 24 hours) at 12/04/15 1738 Last data filed at 12/04/15 1625  Gross per 24 hour  Intake   1242 ml  Output   2325 ml  Net  -1083 ml   Filed Weights   12/02/15 2326 12/03/15 0346 12/04/15 0412  Weight: 107.956 kg (238 lb) 103.511 kg (228 lb 3.2 oz) 96.662 kg (213 lb 1.6 oz)    Exam:   General:  Comfortable flat.   Cardiovascular: RRR without MGR  Respiratory: CTA without WRR  Abdomen: S, NT, ND  Ext: no CCE  Basic Metabolic Panel:  Recent Labs Lab 12/02/15 2340 12/03/15 0527 12/04/15 0418  NA 138  --  139  K 4.0  --  3.3*  CL 108  --  103  CO2 23  --  25  GLUCOSE 112*  --  91  BUN 32*  --  29*  CREATININE 3.07* 3.07* 3.06*  CALCIUM 8.6*  --  9.2  MG  --  2.0  --    Liver Function Tests: No results for input(s): AST, ALT, ALKPHOS, BILITOT, PROT, ALBUMIN in the last 168 hours. No results for input(s): LIPASE, AMYLASE in the last 168 hours. No results for input(s): AMMONIA in the last 168 hours. CBC:  Recent Labs Lab 12/02/15 2340 12/03/15 0527  WBC 8.3 8.6  NEUTROABS 5.1  --   HGB 12.5* 12.3*  HCT 37.8* 36.2*  MCV 84.2 84.2  PLT 231 234   Cardiac Enzymes:  Recent  Labs Lab 12/02/15 2340 12/03/15 0527 12/03/15 1034 12/03/15 1715  TROPONINI 0.18* 0.13* 0.13* 0.12*   BNP (last 3 results)  Recent Labs  12/02/15 2340  BNP 1236.3*    ProBNP (last 3 results) No results for input(s): PROBNP in the last 8760 hours.  CBG: No results for input(s): GLUCAP in the last 168 hours.  No results found for this or any previous visit (from the past 240 hour(s)).   Studies: Dg Chest 2 View  12/03/2015  CLINICAL DATA:  Acute onset of shortness of breath, worse when lying down. Initial encounter. EXAM: CHEST  2 VIEW COMPARISON:  None. FINDINGS: The lungs are well-aerated. Vascular congestion is noted. There is no evidence of focal opacification, pleural effusion or  pneumothorax. The heart is borderline normal in size. No acute osseous abnormalities are seen. IMPRESSION: Vascular congestion noted; lungs remain grossly clear. Electronically Signed   By: Roanna Raider M.D.   On: 12/03/2015 00:09   US Renal  12/03/2015  CLINICAL DATA:  Renal failure EXAM: RENAL / URINARY TRACT ULTRASOUND COMPLETE COMPARISON:  None. FINDINGS: Right Kidney: Length: 9.3 cm. Echogenicity and renal cortical thickness are within normal limits. No mass, perinephric fluid, or hydronephrosis visualized. No sonographically demonstrable calculus or ureterectasis. Left Kidney: Length: 9.0 cm. Echogenicity and renal cortical thickness are within normal limits. No perinephric fluid or hydronephrosis visualized. There is a cyst in the mid right kidney measuring 1.0 x 1.0 x 0.9 cm. There is no sonographically demonstrable calculus or ureterectasis. Bladder: Appears normal for degree of bladder distention. IMPRESSION: Small left renal cyst. No obstructing foci in either kidney. Renal cortical thickness and echogenicity are within normal limits bilaterally. Kidneys are rather small, a finding that may be seen with medical renal disease. Electronically Signed   By: Bretta Bang III M.D.   On: 12/03/2015 10:22    Scheduled Meds: . amLODipine  10 mg Oral Daily  . aspirin EC  81 mg Oral Daily  . enoxaparin (LOVENOX) injection  40 mg Subcutaneous Q24H  . furosemide  60 mg Intravenous BID  . potassium chloride  40 mEq Oral Daily  . tamsulosin  0.4 mg Oral Daily   Continuous Infusions:   Time spent: 15 minutes  Ambria Mayfield L  [facility]  [email or web address], password [facility] 12/04/2015, 5:38 PM  LOS: 1 day

## 2015-12-04 NOTE — Progress Notes (Signed)
Pt A/Ox4, steady on feet, walked in hallway tonight. Pt states he does not want bed alarm on

## 2015-12-04 NOTE — Progress Notes (Signed)
  Echocardiogram 2D Echocardiogram has been performed.  Edgar [name], Edgar [name] 12/04/2015, 3:43 PM

## 2015-12-05 DIAGNOSIS — D631 Anemia in chronic kidney disease: Secondary | ICD-10-CM

## 2015-12-05 DIAGNOSIS — I509 Heart failure, unspecified: Secondary | ICD-10-CM

## 2015-12-05 LAB — BASIC METABOLIC PANEL
Anion gap: 12 (ref 5–15)
BUN: 30 mg/dL — ABNORMAL HIGH (ref 6–20)
CO2: 25 mmol/L (ref 22–32)
Calcium: 9.2 mg/dL (ref 8.9–10.3)
Chloride: 100 mmol/L — ABNORMAL LOW (ref 101–111)
Creatinine, Ser: 2.91 mg/dL — ABNORMAL HIGH (ref 0.61–1.24)
GFR calc Af Amer: 26 mL/min — ABNORMAL LOW (ref >60)
GFR calc non Af Amer: 23 mL/min — ABNORMAL LOW (ref >60)
Glucose, Bld: 108 mg/dL — ABNORMAL HIGH (ref 65–99)
Potassium: 3.3 mmol/L — ABNORMAL LOW (ref 3.5–5.1)
Sodium: 137 mmol/L (ref 135–145)

## 2015-12-05 MED ORDER — CARVEDILOL 6.25 MG PO TABS
6.2500 mg | ORAL_TABLET | Freq: Two times a day (BID) | ORAL | Status: DC
  Administered 2015-12-05 – 2015-12-07 (×4): 6.25 mg via ORAL
  Filled 2015-12-05 (×4): qty 1

## 2015-12-05 MED ORDER — POTASSIUM CHLORIDE CRYS ER 20 MEQ PO TBCR
40.0000 meq | EXTENDED_RELEASE_TABLET | Freq: Once | ORAL | Status: AC
  Administered 2015-12-05: 40 meq via ORAL
  Filled 2015-12-05: qty 2

## 2015-12-05 MED ORDER — HYDRALAZINE HCL 25 MG PO TABS
25.0000 mg | ORAL_TABLET | Freq: Three times a day (TID) | ORAL | Status: DC
  Administered 2015-12-05 – 2015-12-07 (×7): 25 mg via ORAL
  Filled 2015-12-05 (×7): qty 1

## 2015-12-05 MED ORDER — AMLODIPINE BESYLATE 5 MG PO TABS
5.0000 mg | ORAL_TABLET | Freq: Every day | ORAL | Status: DC
  Administered 2015-12-06 – 2015-12-07 (×2): 5 mg via ORAL
  Filled 2015-12-05 (×2): qty 1

## 2015-12-05 MED ORDER — ISOSORBIDE MONONITRATE ER 30 MG PO TB24
30.0000 mg | ORAL_TABLET | Freq: Every day | ORAL | Status: DC
  Administered 2015-12-05 – 2015-12-07 (×3): 30 mg via ORAL
  Filled 2015-12-05 (×3): qty 1

## 2015-12-05 NOTE — Care Management Note (Addendum)
Case Management Note  Patient Details  Name: Erby PianChristopher Issa MRN: [id number] Date of Birth: 05/29/1961  Subjective/Objective:      CHF              Action/Plan: NCM spoke to pt and states he goes to [facility], his PCP is Dr Joseph ArtWoods. [facility] medications from the [facility]. NCM contacted [facility], [id number]#[phone], Dr. Harriett RushWood's fax # [phone]. Pt has follow up appt with Dr. Sondra ComeSamuel Woods on 01/04/2015. [facility] will contact pt post dc to arrange appt to follow up within a week. Requested NCM fax dc summary and Rx to [facility] when available.   Expected Discharge Date:  12/05/2015               Expected Discharge Plan:  Home/Self Care  In-House Referral:  NA  Discharge planning Services  CM Consult  Post Acute Care Choice:  NA Choice offered to:  NA  DME Arranged:  N/A DME Agency:  NA  HH Arranged:  NA HH Agency:  NA  Status of Service:  Completed, signed off  Medicare Important Message Given:    Date Medicare IM Given:    Medicare IM give by:    Date Additional Medicare IM Given:    Additional Medicare Important Message give by:     If discussed at Long Length of Stay Meetings, dates discussed:    Additional Comments:  [name], Zared Knoth Ellen, RN 12/05/2015, 10:40 AM

## 2015-12-05 NOTE — Research (Signed)
REDS_0  Informed Consent   Subject Name: Edgar [name]  Subject met inclusion and exclusion criteria.  The informed consent form, study requirements and expectations were reviewed with the subject and questions and concerns were addressed prior to the signing of the consent form.  The subject verbalized understanding of the trail requirements.  The subject agreed to participate in the REDS_1  trial and signed the informed consent.  The informed consent was obtained prior to performance of any protocol-specific procedures for the subject.  A copy of the signed informed consent was given to the subject and a copy was placed in the subject's medical record.  [name] 12/05/2015, 10:06

## 2015-12-05 NOTE — Progress Notes (Signed)
Heart Failure Navigator Consult Note  Presentation: Edgar [name] is a 54 y.o. 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  The patient reports feeling well until about 5 days ago when he started to develop dyspnea with exertion, dyspnea with lying down relieved with sitting up, dry cough and frequent nocturia. This was in the absence of chest discomfort/pressure, weight gain, or leg swelling. Today, the symptoms were so bad he came to the ER. Of note, per CareEverywhere, the patient was seen in [facility] three months ago for dyspnea, was recommended for admission at that time for CHF and refused.   In the ED tonight, the patient had acute on chronic renal failure (Cr 3.07 mg/dL from 1.612.54 mg/dL [facility] minimally elevated troponin and d-dimer, no leukocytosis, and BNP 1236 pg/mL. A chest x-ray showed pulmonary edema, but the exam was not impressive for fluid overload. TRH were asked to admit for presumed CHF and AoCKI.  Past Medical History  Diagnosis Date  . CHF (congestive heart failure) (HCC)     Years ago, treated and no recurrence  . Stroke [facility](HCC)     2014, residual R sided deficits  . Hypertension   . CKD (chronic kidney disease)     Social History   Social History  . Marital Status: Single    Spouse Name: N/A  . Number of Children: N/A  . Years of Education: N/A   Social History Main Topics  . Smoking status: Never Smoker   . Smokeless tobacco: Never Used  . Alcohol Use: No  . Drug Use: None  . Sexual Activity: Not Asked   Other Topics Concern  . None   Social History Narrative    ECHO: pending  BNP    Component Value Date/Time   BNP 1236.3* 12/02/2015 2340    ProBNP No results found for: PROBNP   Education Assessment and  Provision:  Detailed education and instructions provided on heart failure disease management including the following:  Signs and symptoms of Heart Failure When to call the physician Importance of daily weights Low sodium diet Fluid restriction Medication management Anticipated future follow-up appointments  Patient education given on each of the above topics.  Patient acknowledges understanding and acceptance of all instructions.   I spoke with Edgar [name] regarding his HF diagnosis.  He tells me that he has never been told before that he had HF.  He does admit that he has hypertension "for years".  He does not weigh.  He is a long distance truck driver and admits that weighing daily as well as finding low sodium options on the road to eat will be a challenge.  I explained the importance of daily weights and how weight gains relate to the signs and symptoms of HF.  I also reviewed a low sodium diet and high sodium foods to avoid.  I also reviewed the signs and symptoms of HF and when to call the physician.   [facility] and will follow up there.    Education Materials:  "Living Better With Heart Failure" Booklet, Daily Weight Tracker Tool    High Risk Criteria for Readmission and/or Poor Patient Outcomes:   EF <30%- echo pending  2 or more admissions in 6 months- No  Difficult social situation- No  Demonstrates medication noncompliance- denies   Barriers of Care:  Occupation, Knowledge and compliance  Discharge Planning:   Plans to return home with his wife.

## 2015-12-05 NOTE — Progress Notes (Signed)
Pt potassium 3.3 this AM, K Kirby paged, no new orders at this time

## 2015-12-05 NOTE — Progress Notes (Signed)
Patient Name: Edgar PianChristopher [name] Date of Encounter: 12/05/2015     Principal Problem:   Acute systolic congestive heart failure (HCC) Active Problems:   Hypertension   Acute kidney injury superimposed on chronic kidney disease (HCC)   Anemia in chronic renal disease   Acute congestive heart failure (HCC)   Acute heart failure (HCC)    SUBJECTIVE Feeling better. Seemed surprised when i told him his heart was weak. I reassured him that we could put him on good medications to help strengthen heart and stressed compliance.   CURRENT MEDS . amLODipine  10 mg Oral Daily  . aspirin EC  81 mg Oral Daily  . enoxaparin (LOVENOX) injection  40 mg Subcutaneous Q24H  . furosemide  60 mg Intravenous BID  . potassium chloride  40 mEq Oral Daily  . tamsulosin  0.4 mg Oral Daily    OBJECTIVE  Filed Vitals:   12/04/15 2211 12/05/15 0539 12/05/15 0544 12/05/15 0633  BP: 153/105 171/93 180/102 153/95  Pulse:  85    Temp:  98 F (36.7 C)    TempSrc:  Oral    Resp:  18    Height:      Weight:  212 lb (96.163 kg)    SpO2:  98%      Intake/Output Summary (Last 24 hours) at 12/05/15 1044 Last data filed at 12/05/15 0902  Gross per 24 hour  Intake   1002 ml  Output   2800 ml  Net  -1798 ml   Filed Weights   12/03/15 0346 12/04/15 0412 12/05/15 0539  Weight: 228 lb 3.2 oz (103.511 kg) 213 lb 1.6 oz (96.662 kg) 212 lb (96.163 kg)    PHYSICAL EXAM  General: Pleasant, NAD. Neuro: Alert and oriented X 3. Moves all extremities spontaneously. Psych: Normal affect. HEENT:  Normal  Neck: Supple without bruits or JVD. Lungs:  Resp regular and unlabored, CTA. Heart: RRR no s3, s4, or murmurs. Abdomen: Soft, non-tender, non-distended, BS + x 4.  Extremities: No clubbing, cyanosis or edema. DP/PT/Radials 2+ and equal bilaterally.  Accessory Clinical Findings  CBC  Recent Labs  12/02/15 2340 12/03/15 0527  WBC 8.3 8.6  NEUTROABS 5.1  --   HGB 12.5* 12.3*  HCT 37.8* 36.2*    MCV 84.2 84.2  PLT 231 234   Basic Metabolic Panel  Recent Labs  12/03/15 0527 12/04/15 0418 12/05/15 0333  NA  --  139 137  K  --  3.3* 3.3*  CL  --  103 100*  CO2  --  25 25  GLUCOSE  --  91 108*  BUN  --  29* 30*  CREATININE 3.07* 3.06* 2.91*  CALCIUM  --  9.2 9.2  MG 2.0  --   --    Cardiac Enzymes  Recent Labs  12/03/15 0527 12/03/15 1034 12/03/15 1715  TROPONINI 0.13* 0.13* 0.12*   BNP Invalid input(s): POCBNP D-Dimer  Recent Labs  12/02/15 2340  DDIMER 0.57*    Thyroid Function Tests  Recent Labs  12/03/15 0527  TSH 0.995    TELE  NSR with PVCs and runs of AIVR . NSVT (a few 3 beat runs)  Radiology/Studies  Dg Chest 2 View  12/03/2015  CLINICAL DATA:  Acute onset of shortness of breath, worse when lying down. Initial encounter. EXAM: CHEST  2 VIEW COMPARISON:  None. FINDINGS: The lungs are well-aerated. Vascular congestion is noted. There is no evidence of focal opacification, pleural effusion or pneumothorax. The heart is borderline  normal in size. No acute osseous abnormalities are seen. IMPRESSION: Vascular congestion noted; lungs remain grossly clear. Electronically Signed   By: Roanna Raider M.D.   On: 12/03/2015 00:09   US Renal  12/03/2015  CLINICAL DATA:  Renal failure EXAM: RENAL / URINARY TRACT ULTRASOUND COMPLETE COMPARISON:  None. FINDINGS: Right Kidney: Length: 9.3 cm. Echogenicity and renal cortical thickness are within normal limits. No mass, perinephric fluid, or hydronephrosis visualized. No sonographically demonstrable calculus or ureterectasis. Left Kidney: Length: 9.0 cm. Echogenicity and renal cortical thickness are within normal limits. No perinephric fluid or hydronephrosis visualized. There is a cyst in the mid right kidney measuring 1.0 x 1.0 x 0.9 cm. There is no sonographically demonstrable calculus or ureterectasis. Bladder: Appears normal for degree of bladder distention. IMPRESSION: Small left renal cyst. No  obstructing foci in either kidney. Renal cortical thickness and echogenicity are within normal limits bilaterally. Kidneys are rather small, a finding that may be seen with medical renal disease. Electronically Signed   By: Bretta Bang III M.D.   On: 12/03/2015 10:22    ASSESSMENT AND PLAN Mr. Carlyon is a 54 y.o.malewith a history of HTN, CVA, CKD III, and reported history of heart failure (2000)  admitted on 12/02/15 with 5 days of progressing DOE and orthopnea.   Acute systolic CHF- prelim read of echo with EF ~35% with global HK. - presented with clinical CHF, echo pending to further define the etiology. 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  - started on lasix  IV bid and kidney function continues to improve.  Net neg 2L and weight down 1Lb from yesterday. Will continue this dosing until creat bumps - BPs still high. Will start him on Coreg 6.25 mg BID and hydralazine  TID with imdur  and see how he does. We can titrate up as BP allows. I will decrease amlodipine from 10 to . If we need more room we can discontinue amlodipine. Not a candidate for ACE/ARB due to CKD  CKD: elevated Cr to 3.07 on admission. Now improved to 2.91 with diuresis. Looking back Cr 1.8 to 2.5 back in 2008, in 08/2015 of this year he was 2.5. Overall he has significant CKD and does not appear that far from his baseline. There may be some component of venous congestion and CHF contributing to his poor renal function.  HTN - elevated bp's. Started on norvasc  daily on admission. As above I have started Coreg 6.25 mg BID and hydralazine  TID with imdur  in the setting of LV dysfunction.  I will decrease amlodipine from 10 to  to make more room.   Troponin elevation - mild, trending down (0.18--> 0.13--> 0.12). Nonspecific finding in setting of severely elevated bp's, renal disease, clinical CHF. No CP. Likely demand ischemia.  - f/u echo  results   AIVR- noted on tele. He alternates between NSR and AIVR  NSVT- multiple 3 beat runs. We have started a BB as above.  Hypokalemia- will replete  Signed, Janetta Hora PA-C  Pager [phone]   I have examined the patient and reviewed assessment and plan and discussed with patient.  Agree with above as stated.  Stressed importance of BP control.  Use hydralazine and nitrates.  Not a candidate for ACE-I due to renal insufficiency.  VARANASI,JAYADEEP S.

## 2015-12-05 NOTE — Progress Notes (Signed)
[facility]                                                                              Patient Demographics  Edgar [name], is a 54 y.o. male, DOB - [birth year], ZOX:[id number]  Admit date - 12/02/2015   Admitting Physician Briscoe Deutscher, MD  Outpatient Primary MD for the patient is Pcp Not In System  LOS - 2   Chief Complaint  Patient presents with  . Shortness of Breath      HPI on 12/03/2015 by Dr. Joen Laura Toben Acuna is a 54 y.o. 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  The patient reports feeling well until about 5 days ago when he started to develop dyspnea with exertion, dyspnea with lying down relieved with sitting up, dry cough and frequent nocturia. This was in the absence of chest discomfort/pressure, weight gain, or leg swelling. Today, the symptoms were so bad he came to the ER. Of note, per [facility], the patient was seen in [facility] three months ago for dyspnea, was recommended for admission at that time for CHF and refused.   In the ED tonight, the patient had acute on chronic renal failure (Cr 3.07 mg/dL from 8.11 mg/dL in September at [facility]), minimally elevated troponin and d-dimer, no leukocytosis, and BNP 1236 pg/mL. A chest x-ray showed pulmonary edema, but the exam was not impressive for fluid overload. TRH were asked to admit for presumed CHF and AoCKI.  Assessment & Plan   Acute congestive heart failure, unspecified type -Cardiology consulted and appreciated -Upon admission, BNP 1236 -Patient states he's had heart failure for at least 17 years -Continue to monitor daily weights, intake and output -Urine output over the last 24 hours 2675 -Weight down 11 kg -Echocardiogram pending -Continue aspirin, Lasix  Mild  Elevated troponin -Troponin 0.13, trended down to 0.12 -Continue aspirin -Pending echocardiogram  Uncontrolled hypertension -Continue Norvasc   Acute on Chronic kidney disease, stage III -Renal ultrasound showed small left renal cyst, no obstructing foci, ? Medical renal disease  -Creatinine improving, currently 2.91  -Continue to monitor BMP  Anemia of chronic renal disease -Hemoglobin stable, currently 12.3   Hypokalemia -Potassium 3.3, we'll continue to replace and monitor  -Likely complicated by diuresis  -Magnesium 2   Code Status: Full  Family Communication: None at bedside  Disposition Plan: Admitted. Pending further recommendations will cardiology and echocardiogram  Time Spent in minutes   30  minutes  Procedures  Echocardiogram Renal ultrasound   Consults   Cardiology   DVT Prophylaxis  Lovenox  Lab Results  Component Value Date   PLT 234 12/03/2015    Medications  Scheduled Meds: . amLODipine  10 mg Oral Daily  . aspirin EC  81 mg Oral Daily  . enoxaparin (LOVENOX) injection  40 mg Subcutaneous Q24H  . furosemide  60 mg Intravenous BID  . potassium chloride  40 mEq Oral Daily  . tamsulosin  0.4 mg Oral Daily   Continuous Infusions:  PRN Meds:.hydrALAZINE  Antibiotics    Anti-infectives    None      Subjective:   Edgar [name] seen and examined today.  Patient has no complaints today. Would like to go home. Denies any chest pain, shortness breath, palpitations, headache or dizziness, abdominal pain, change in bowel pattern.  Objective:   Filed Vitals:   12/04/15 2211 12/05/15 0539 12/05/15 0544 12/05/15 0633  BP: 153/105 171/93 180/102 153/95  Pulse:  85    Temp:  98 F (36.7 C)    TempSrc:  Oral    Resp:  18    Height:      Weight:  96.163 kg (212 lb)    SpO2:  98%      Wt Readings from Last 3 Encounters:  12/05/15 96.163 kg (212 lb)     Intake/Output Summary (Last 24 hours) at 12/05/15 1038 Last data filed at  12/05/15 0902  Gross per 24 hour  Intake   1002 ml  Output   2800 ml  Net  -1798 ml    Exam  General: Well developed, well nourished, NAD, appears stated age  HEENT: NCAT, mucous membranes moist.   Cardiovascular: S1 S2 auscultated, no rubs, murmurs or gallops. Regular rate and rhythm.  Respiratory: Clear to auscultation bilaterally with equal chest rise  Abdomen: Soft, nontender, nondistended, + bowel sounds  Extremities: warm dry without cyanosis clubbing or edema  Neuro: AAOx3, nonfocal  Psych: Normal affect and demeanor with intact judgement and insight  Data Review   Micro Results No results found for this or any previous visit (from the past 240 hour(s)).  Radiology Reports Dg Chest 2 View  12/03/2015  CLINICAL DATA:  Acute onset of shortness of breath, worse when lying down. Initial encounter. EXAM: CHEST  2 VIEW COMPARISON:  None. FINDINGS: The lungs are well-aerated. Vascular congestion is noted. There is no evidence of focal opacification, pleural effusion or pneumothorax. The heart is borderline normal in size. No acute osseous abnormalities are seen. IMPRESSION: Vascular congestion noted; lungs remain grossly clear. Electronically Signed   By: Roanna RaiderJeffery  Chang M.D.   On: 12/03/2015 00:09   [place] Renal  12/03/2015  CLINICAL DATA:  Renal failure EXAM: RENAL / URINARY TRACT ULTRASOUND COMPLETE COMPARISON:  None. FINDINGS: Right Kidney: Length: 9.3 cm. Echogenicity and renal cortical thickness are within normal limits. No mass, perinephric fluid, or hydronephrosis visualized. No sonographically demonstrable calculus or ureterectasis. Left Kidney: Length: 9.0 cm. Echogenicity and renal cortical thickness are within normal limits. No perinephric fluid or hydronephrosis visualized. There is a cyst in the mid right kidney measuring 1.0 x 1.0 x 0.9 cm. There is no sonographically demonstrable calculus or ureterectasis. Bladder: Appears normal for degree of bladder distention.  IMPRESSION: Small left renal cyst. No obstructing foci in either kidney. Renal cortical thickness and echogenicity are within normal limits bilaterally. Kidneys are rather small, a finding that may be seen with medical renal disease. Electronically Signed   By: Bretta BangWilliam  Woodruff III M.D.   On: 12/03/2015 10:22    CBC  Recent Labs Lab 12/02/15 2340 12/03/15 0527  WBC 8.3 8.6  HGB 12.5* 12.3*  HCT 37.8* 36.2*  PLT 231 234  MCV 84.2 84.2  MCH 27.8 28.6  MCHC 33.1 34.0  RDW 15.2 15.4  LYMPHSABS 2.3  --   MONOABS 0.7  --   EOSABS 0.1  --   BASOSABS 0.0  --     Chemistries   Recent Labs Lab 12/02/15 2340 12/03/15 0527 12/04/15  1610 12/05/15 0333  NA 138  --  139 137  K 4.0  --  3.3* 3.3*  CL 108  --  103 100*  CO2 23  --  25 25  GLUCOSE 112*  --  91 108*  BUN 32*  --  29* 30*  CREATININE 3.07* 3.07* 3.06* 2.91*  CALCIUM 8.6*  --  9.2 9.2  MG  --  2.0  --   --    ------------------------------------------------------------------------------------------------------------------ estimated creatinine clearance is 34.1 mL/min (by C-G formula based on Cr of 2.91). ------------------------------------------------------------------------------------------------------------------ No results for input(s): HGBA1C in the last 72 hours. ------------------------------------------------------------------------------------------------------------------ No results for input(s): CHOL, HDL, LDLCALC, TRIG, CHOLHDL, LDLDIRECT in the last 72 hours. ------------------------------------------------------------------------------------------------------------------  Recent Labs  12/03/15 0527  TSH 0.995   ------------------------------------------------------------------------------------------------------------------ No results for input(s): VITAMINB12, FOLATE, FERRITIN, TIBC, IRON, RETICCTPCT in the last 72 hours.  Coagulation profile No results for input(s): INR, PROTIME in the last 168  hours.   Recent Labs  12/02/15 2340  DDIMER 0.57*    Cardiac Enzymes  Recent Labs Lab 12/03/15 0527 12/03/15 1034 12/03/15 1715  TROPONINI 0.13* 0.13* 0.12*   ------------------------------------------------------------------------------------------------------------------ Invalid input(s): POCBNP    Kruz Chiu D.O. on 12/05/2015 at 10:38 AM  Between 7am to 7pm - Pager - [phone]  After 7pm go to [email or web address] - password [email or web address]  And look for the night coverage person covering for me after hours  [facility] Office  [phone]

## 2015-12-06 DIAGNOSIS — R0600 Dyspnea, unspecified: Secondary | ICD-10-CM | POA: Insufficient documentation

## 2015-12-06 DIAGNOSIS — E876 Hypokalemia: Secondary | ICD-10-CM

## 2015-12-06 LAB — BASIC METABOLIC PANEL
Anion gap: 12 (ref 5–15)
BUN: 37 mg/dL — ABNORMAL HIGH (ref 6–20)
CHLORIDE: 102 mmol/L (ref 101–111)
CO2: 20 mmol/L — AB (ref 22–32)
CREATININE: 3.22 mg/dL — AB (ref 0.61–1.24)
Calcium: 8.9 mg/dL (ref 8.9–10.3)
GFR calc non Af Amer: 20 mL/min — ABNORMAL LOW (ref >60)
GFR, EST AFRICAN AMERICAN: 23 mL/min — AB (ref >60)
Glucose, Bld: 101 mg/dL — ABNORMAL HIGH (ref 65–99)
POTASSIUM: 4.2 mmol/L (ref 3.5–5.1)
Sodium: 134 mmol/L — ABNORMAL LOW (ref 135–145)

## 2015-12-06 LAB — CBC
HEMATOCRIT: 42.3 % (ref 39.0–52.0)
HEMOGLOBIN: 14 g/dL (ref 13.0–17.0)
MCH: 27.5 pg (ref 26.0–34.0)
MCHC: 33.1 g/dL (ref 30.0–36.0)
MCV: 82.9 fL (ref 78.0–100.0)
PLATELETS: 274 10*3/uL (ref 150–400)
RBC: 5.1 MIL/uL (ref 4.22–5.81)
RDW: 15 % (ref 11.5–15.5)
WBC: 11 10*3/uL — ABNORMAL HIGH (ref 4.0–10.5)

## 2015-12-06 MED ORDER — FUROSEMIDE 10 MG/ML IJ SOLN: 60.0000 mg | Freq: Every day | INTRAMUSCULAR | Status: DC

## 2015-12-06 MED ORDER — GUAIFENESIN 100 MG/5ML PO SOLN
5.0000 mL | ORAL | Status: DC | PRN
  Administered 2015-12-06 – 2015-12-07 (×2): 100 mg via ORAL
  Filled 2015-12-06 (×2): qty 5

## 2015-12-06 NOTE — Progress Notes (Signed)
Patient Name: Edgar [name] Date of Encounter: 12/06/2015     Principal Problem:   Acute systolic congestive heart failure (HCC) Active Problems:   Hypertension   Acute kidney injury superimposed on chronic kidney disease (HCC)   Anemia in chronic renal disease   Acute congestive heart failure (HCC)   Acute heart failure (HCC)    SUBJECTIVE Breathing back to baseline. Very eager to leave. 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 I explained how we would like him to be out of work for at least a week and seen in our clinic for follow up. He is not sure he will be able to do that but will think about it.   CURRENT MEDS . amLODipine  5 mg Oral Daily  . aspirin EC  81 mg Oral Daily  . carvedilol  6.25 mg Oral BID WC  . enoxaparin (LOVENOX) injection  40 mg Subcutaneous Q24H  . [START ON 12/07/2015] furosemide  60 mg Intravenous Daily  . hydrALAZINE  25 mg Oral TID  . isosorbide mononitrate  30 mg Oral Daily  . potassium chloride  40 mEq Oral Daily  . tamsulosin  0.4 mg Oral Daily    OBJECTIVE  Filed Vitals:   12/05/15 1326 12/05/15 2017 12/05/15 2153 12/06/15 0617  BP: 128/86 138/87 149/91 144/104  Pulse: 101 85  93  Temp: 98.6 F (37 C) 98.7 F (37.1 C)  98.6 F (37 C)  TempSrc: Oral Oral  Oral  Resp: 18 18  18   Height:      Weight:    213 lb 8 oz (96.843 kg)  SpO2: 98% 99%  100%    Intake/Output Summary (Last 24 hours) at 12/06/15 0918 Last data filed at 12/06/15 0900  Gross per 24 hour  Intake    804 ml  Output   1205 ml  Net   -401 ml   Filed Weights   12/04/15 0412 12/05/15 0539 12/06/15 0617  Weight: 213 lb 1.6 oz (96.662 kg) 212 lb (96.163 kg) 213 lb 8 oz (96.843 kg)    PHYSICAL EXAM  General: Pleasant, NAD. Neuro: Alert and oriented X 3. Moves all extremities spontaneously. Psych: Normal affect. HEENT:  Normal  Neck: Supple without bruits or JVD. Lungs:  Resp regular and unlabored, CTA. Heart: RRR no s3, s4, or  murmurs. Abdomen: Soft, non-tender, non-distended, BS + x 4.  Extremities: No clubbing, cyanosis or edema. DP/PT/Radials 2+ and equal bilaterally.  Accessory Clinical Findings  CBC  Recent Labs  12/06/15 0320  WBC 11.0*  HGB 14.0  HCT 42.3  MCV 82.9  PLT 274   Basic Metabolic Panel  Recent Labs  12/05/15 0333 12/06/15 0320  NA 137 134*  K 3.3* 4.2  CL 100* 102  CO2 25 20*  GLUCOSE 108* 101*  BUN 30* 37*  CREATININE 2.91* 3.22*  CALCIUM 9.2 8.9   Cardiac Enzymes  Recent Labs  12/03/15 1034 12/03/15 1715  TROPONINI 0.13* 0.12*   BNP Invalid input(s): POCBNP D-Dimer No results for input(s): DDIMER in the last 72 hours.  Thyroid Function Tests No results for input(s): TSH, T4TOTAL, T3FREE, THYROIDAB in the last 72 hours.  Invalid input(s): FREET3  TELE NSR with some sinus tachy  Radiology/Studies  Dg Chest 2 View  12/03/2015  CLINICAL DATA:  Acute onset of shortness of breath, worse when lying down. Initial encounter. EXAM: CHEST  2 VIEW COMPARISON:  None. FINDINGS: The lungs are well-aerated. Vascular congestion is noted. There is  no evidence of focal opacification, pleural effusion or pneumothorax. The heart is borderline normal in size. No acute osseous abnormalities are seen. IMPRESSION: Vascular congestion noted; lungs remain grossly clear. Electronically Signed   By: Roanna Raider M.D.   On: 12/03/2015 00:09   US Renal  12/03/2015  CLINICAL DATA:  Renal failure EXAM: RENAL / URINARY TRACT ULTRASOUND COMPLETE COMPARISON:  None. FINDINGS: Right Kidney: Length: 9.3 cm. Echogenicity and renal cortical thickness are within normal limits. No mass, perinephric fluid, or hydronephrosis visualized. No sonographically demonstrable calculus or ureterectasis. Left Kidney: Length: 9.0 cm. Echogenicity and renal cortical thickness are within normal limits. No perinephric fluid or hydronephrosis visualized. There is a cyst in the mid right kidney measuring 1.0 x 1.0 x  0.9 cm. There is no sonographically demonstrable calculus or ureterectasis. Bladder: Appears normal for degree of bladder distention. IMPRESSION: Small left renal cyst. No obstructing foci in either kidney. Renal cortical thickness and echogenicity are within normal limits bilaterally. Kidneys are rather small, a finding that may be seen with medical renal disease. Electronically Signed   By: Bretta Bang III M.D.   On: 12/03/2015 10:22    2D ECHO: 12/04/2015 LV EF: 25- 30% Study Conclusions - Left ventricle: The cavity size was mildly dilated. There was moderate concentric hypertrophy. Systolic function was severely reduced. The estimated ejection fraction was in the range of 25% to 30%. Diffuse hypokinesis. Features are consistent with a pseudonormal left ventricular filling pattern, with concomitant abnormal relaxation and increased filling pressure (grade 2 diastolic dysfunction). - Aortic valve: Trileaflet; mildly thickened, mildly calcified leaflets. - Mitral valve: There was mild regurgitation. - Left atrium: The atrium was moderately dilated. - Atrial septum: There was increased thickness of the septum, consistent with lipomatous hypertrophy   ASSESSMENT AND PLAN Edgar [name] is a 54 y.o.malewith a history of HTN, CVA, CKD III, and reported history of heart failure (2000) who was admitted on 12/02/15 with 5 days of progressing DOE and orthopnea.   Acute combined S/D CHF- ECHO 12/05/15 with EF 25- 30% with diffuse HK, G2DD, mild MR, mod LA dilation - systolic dysfunction likely due to hypertensive CM.  - started on lasix  IV BID and kidney function initially improved. Net neg 2.4L. Weights appear inaccurate. Now creat bumped and I will stop IV lasix. - BPs with moderate control. Started on Coreg 6.25 mg BID and hydralazine  TID with imdur  yesterday (12/05/15). We can titrate up as BP allows. I will decrease amlodipine from 10 to . If we need more  room we can discontinue amlodipine. Not a candidate for ACE/ARB due to CKD  CKD: elevated Cr to 3.07 on admission. It initially improved with diuresis and now bumped to 3.22. I will stop IV lasix and start po lasix tomorrow AM if creat improves. - renal US this admission with small kidneys c/w medical renal disease.  - he is followed for this at the [facility]  HTN- moderate control. Started on norvasc  daily on admission. As above I have started Coreg 6.25 mg BID and hydralazine  TID with imdur  in the setting of LV dysfunction.  I decreased amlodipine from 10 to  to make more room.   Troponin elevation - mild, trending down (0.18--> 0.13--> 0.12). Nonspecific finding in setting of severely elevated bp's, renal disease, clinical CHF. No CP. Likely demand ischemia.   AIVR- noted on tele. He alternates between NSR and AIVR  NSVT-  much improved on BB   Hypokalemia-  repleted.   SignedJanetta Hora PA-C  Pager [phone]  I have examined the patient and reviewed assessment and plan and discussed with patient.  Agree with above as stated.  Breathing better.  Renal function worse.  I stressed the importance of BP control for the patient.  He will need close f/u of his BP as well to best titrate the meds.  No ACE-I or ARB due to renal dysfunction.  EF decreased , likely due to hypertension.  [place].

## 2015-12-06 NOTE — Progress Notes (Signed)
[facility]                                                                              Patient Demographics  Edgar [name], is a 54 y.o. male, DOB - January 29, 1961, ZOX:[id number]  Admit date - 12/02/2015   Admitting Physician Briscoe Deutscher, MD  Outpatient Primary MD for the patient is [facility], MD  LOS - 3   Chief Complaint  Patient presents with  . Shortness of Breath      HPI on 12/03/2015 by Dr. [name] is a 54 y.o. 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  The patient reports feeling well until about 5 days ago when he started to develop dyspnea with exertion, dyspnea with lying down relieved with sitting up, dry cough and frequent nocturia. This was in the absence of chest discomfort/pressure, weight gain, or leg swelling. Today, the symptoms were so bad he came to the ER. Of note, per [facility], the patient was seen in [facility] three months ago for dyspnea, was recommended for admission at that time for CHF and refused.   In the ED tonight, the patient had acute on chronic renal failure (Cr 3.07 mg/dL from 8.11 mg/dL in September at [facility]), minimally elevated troponin and d-dimer, no leukocytosis, and BNP 1236 pg/mL. A chest x-ray showed pulmonary edema, but the exam was not impressive for fluid overload. TRH were asked to admit for presumed CHF and AoCKI.  Assessment & Plan   Acute congestive heart failure, unspecified type -Cardiology consulted and appreciated -Upon admission, BNP 1236 -Patient states he  had heart failure 17 years ago -Continue to monitor daily weights, intake and output -Urine output over the last 24 hours 2675 -Weight down 11 kg -Echocardiogram EF of 25-30%, grade 2 diastolic dysfunction -Continue  aspirin -No ACEI/ARB due to kidney failure -Cardiology started Coreg 6.25mg  BID, hydralazine  TID, Imdur  daily.  Amlodipine decreased to  daily.  -IV lasix discontinued due to worsening of creatinine   Mild Elevated troponin  -Likely demand ischemia -Troponin 0.13, trended down to 0.12 -Continue aspirin -Echocardiogram as above  Uncontrolled hypertension -Continue Norvasc   Acute on Chronic kidney disease, stage III-IV -Renal ultrasound showed small left renal cyst, no obstructing foci, ? Medical renal disease  -Creatinine 3.22 (spoke with patient, he stated his "baseline was 2.8" and he follows with a nephrologist through [facility]) -Continue to monitor BMP  Anemia of chronic renal disease -Hemoglobin stable, currently 14  Hypokalemia -Resolved, Potassium 4 -Likely complicated by diuresis  -Magnesium 2  -Continue to monitor BMP  Code Status: Full  Family Communication: None at bedside  Disposition Plan: Admitted. BP meds being tritrated.  Continue to monitor Cr. Possible discharge within 24-48 hours.  Time Spent in minutes   30  minutes  Procedures  Echocardiogram Renal ultrasound   Consults   Cardiology   DVT Prophylaxis  Lovenox  Lab Results  Component Value Date   PLT 274 12/06/2015    Medications  Scheduled Meds: . amLODipine  5 mg Oral Daily  . aspirin EC  81 mg Oral Daily  . carvedilol  6.25 mg Oral BID WC  . enoxaparin (LOVENOX) injection  40 mg Subcutaneous Q24H  . hydrALAZINE  25 mg Oral TID  . isosorbide mononitrate  30 mg Oral Daily  . potassium chloride  40 mEq Oral Daily  . tamsulosin  0.4 mg Oral Daily   Continuous Infusions:  PRN Meds:.hydrALAZINE  Antibiotics    Anti-infectives    None      Subjective:   Erby Pianhristopher Edgar [name] seen and examined today.  Patient has no complaints today and feels much improved.  He would like to go home. Denies any chest pain, shortness breath, palpitations, headache or dizziness,  abdominal pain.  Objective:   Filed Vitals:   12/05/15 2153 12/06/15 0617 12/06/15 1004 12/06/15 1005  BP: 149/91 144/104 151/103 151/103  Pulse:  93    Temp:  98.6 F (37 C)    TempSrc:  Oral    Resp:  18    Height:      Weight:  96.843 kg (213 lb 8 oz)    SpO2:  100%      Wt Readings from Last 3 Encounters:  12/06/15 96.843 kg (213 lb 8 oz)     Intake/Output Summary (Last 24 hours) at 12/06/15 1015 Last data filed at 12/06/15 0900  Gross per 24 hour  Intake    804 ml  Output   1205 ml  Net   -401 ml    Exam  General: Well developed, well nourished, NAD  HEENT: NCAT, mucous membranes moist.   Cardiovascular: S1 S2 auscultated, RRR, no murmurs  Respiratory: Clear to auscultation bilaterally with equal chest rise  Abdomen: Soft, nontender, nondistended, + bowel sounds  Extremities: warm dry without cyanosis clubbing or edema  Neuro: AAOx3, nonfocal  Psych: Normal affect and demeanor, pleasant  Data Review   Micro Results No results found for this or any previous visit (from the past 240 hour(s)).  Radiology Reports Dg Chest 2 View  12/03/2015  CLINICAL DATA:  Acute onset of shortness of breath, worse when lying down. Initial encounter. EXAM: CHEST  2 VIEW COMPARISON:  None. FINDINGS: The lungs are well-aerated. Vascular congestion is noted. There is no evidence of focal opacification, pleural effusion or pneumothorax. The heart is borderline normal in size. No acute osseous abnormalities are seen. IMPRESSION: Vascular congestion noted; lungs remain grossly clear. Electronically Signed   By: Roanna RaiderJeffery  Chang M.D.   On: 12/03/2015 00:09   [place] Renal  12/03/2015  CLINICAL DATA:  Renal failure EXAM: RENAL / URINARY TRACT ULTRASOUND COMPLETE COMPARISON:  None. FINDINGS: Right Kidney: Length: 9.3 cm. Echogenicity and renal cortical thickness are within normal limits. No mass, perinephric fluid, or hydronephrosis visualized. No sonographically demonstrable calculus or  ureterectasis. Left Kidney: Length: 9.0 cm. Echogenicity and renal cortical thickness are within normal limits. No perinephric fluid or hydronephrosis visualized. There is a cyst in the mid right kidney measuring 1.0 x 1.0 x 0.9 cm. There is no sonographically demonstrable calculus or ureterectasis. Bladder: Appears normal for degree of bladder distention. IMPRESSION: Small left renal cyst. No obstructing foci in either kidney. Renal cortical thickness and echogenicity are within normal limits bilaterally. Kidneys are rather small, a finding that may be seen with medical renal disease. Electronically Signed   By: Bretta BangWilliam  Woodruff III M.D.   On: 12/03/2015 10:22    CBC  Recent Labs Lab 12/02/15 2340 12/03/15  8295 12/06/15 0320  WBC 8.3 8.6 11.0*  HGB 12.5* 12.3* 14.0  HCT 37.8* 36.2* 42.3  PLT 231 234 274  MCV 84.2 84.2 82.9  MCH 27.8 28.6 27.5  MCHC 33.1 34.0 33.1  RDW 15.2 15.4 15.0  LYMPHSABS 2.3  --   --   MONOABS 0.7  --   --   EOSABS 0.1  --   --   BASOSABS 0.0  --   --     Chemistries   Recent Labs Lab 12/02/15 2340 12/03/15 0527 12/04/15 0418 12/05/15 0333 12/06/15 0320  NA 138  --  139 137 134*  K 4.0  --  3.3* 3.3* 4.2  CL 108  --  103 100* 102  CO2 23  --  25 25 20*  GLUCOSE 112*  --  91 108* 101*  BUN 32*  --  29* 30* 37*  CREATININE 3.07* 3.07* 3.06* 2.91* 3.22*  CALCIUM 8.6*  --  9.2 9.2 8.9  MG  --  2.0  --   --   --    ------------------------------------------------------------------------------------------------------------------ estimated creatinine clearance is 30.9 mL/min (by C-G formula based on Cr of 3.22). ------------------------------------------------------------------------------------------------------------------ No results for input(s): HGBA1C in the last 72 hours. ------------------------------------------------------------------------------------------------------------------ No results for input(s): CHOL, HDL, LDLCALC, TRIG, CHOLHDL,  LDLDIRECT in the last 72 hours. ------------------------------------------------------------------------------------------------------------------ No results for input(s): TSH, T4TOTAL, T3FREE, THYROIDAB in the last 72 hours.  Invalid input(s): FREET3 ------------------------------------------------------------------------------------------------------------------ No results for input(s): VITAMINB12, FOLATE, FERRITIN, TIBC, IRON, RETICCTPCT in the last 72 hours.  Coagulation profile No results for input(s): INR, PROTIME in the last 168 hours.  No results for input(s): DDIMER in the last 72 hours.  Cardiac Enzymes  Recent Labs Lab 12/03/15 0527 12/03/15 1034 12/03/15 1715  TROPONINI 0.13* 0.13* 0.12*   ------------------------------------------------------------------------------------------------------------------ Invalid input(s): POCBNP    Ajaya Crutchfield D.O. on 12/06/2015 at 10:15 AM  Between 7am to 7pm - Pager - [phone]  After 7pm go to [email or web address] - password [email or web address]  And look for the night coverage person covering for me after hours  [facility] Office  [phone]

## 2015-12-07 ENCOUNTER — Telehealth: Payer: Self-pay | Admitting: Cardiology

## 2015-12-07 DIAGNOSIS — I5041 Acute combined systolic (congestive) and diastolic (congestive) heart failure: Secondary | ICD-10-CM

## 2015-12-07 DIAGNOSIS — E876 Hypokalemia: Secondary | ICD-10-CM | POA: Insufficient documentation

## 2015-12-07 LAB — BASIC METABOLIC PANEL
ANION GAP: 10 (ref 5–15)
BUN: 42 mg/dL — ABNORMAL HIGH (ref 6–20)
CO2: 22 mmol/L (ref 22–32)
Calcium: 9.2 mg/dL (ref 8.9–10.3)
Chloride: 102 mmol/L (ref 101–111)
Creatinine, Ser: 3.01 mg/dL — ABNORMAL HIGH (ref 0.61–1.24)
GFR calc Af Amer: 25 mL/min — ABNORMAL LOW (ref >60)
GFR, EST NON AFRICAN AMERICAN: 22 mL/min — AB (ref >60)
Glucose, Bld: 92 mg/dL (ref 65–99)
POTASSIUM: 4.1 mmol/L (ref 3.5–5.1)
SODIUM: 134 mmol/L — AB (ref 135–145)

## 2015-12-07 MED ORDER — AMLODIPINE BESYLATE 5 MG PO TABS: 5.0000 mg | ORAL_TABLET | Freq: Every day | ORAL | Status: DC

## 2015-12-07 MED ORDER — FUROSEMIDE 20 MG PO TABS: 60.0000 mg | ORAL_TABLET | Freq: Every day | ORAL | Status: DC

## 2015-12-07 MED ORDER — ISOSORBIDE MONONITRATE ER 30 MG PO TB24: 30.0000 mg | ORAL_TABLET | Freq: Every day | ORAL | Status: DC

## 2015-12-07 MED ORDER — FUROSEMIDE 40 MG PO TABS
60.0000 mg | ORAL_TABLET | Freq: Every day | ORAL | Status: DC
  Administered 2015-12-07: 60 mg via ORAL
  Filled 2015-12-07: qty 1

## 2015-12-07 MED ORDER — HYDRALAZINE HCL 25 MG PO TABS: 25.0000 mg | ORAL_TABLET | Freq: Three times a day (TID) | ORAL | Status: DC

## 2015-12-07 MED ORDER — POTASSIUM CHLORIDE CRYS ER 15 MEQ PO TBCR: 30.0000 meq | EXTENDED_RELEASE_TABLET | Freq: Every day | ORAL | Status: DC

## 2015-12-07 MED ORDER — POTASSIUM CHLORIDE CRYS ER 20 MEQ PO TBCR
30.0000 meq | EXTENDED_RELEASE_TABLET | Freq: Every day | ORAL | Status: DC
  Administered 2015-12-07: 30 meq via ORAL
  Filled 2015-12-07: qty 1

## 2015-12-07 MED ORDER — CARVEDILOL 12.5 MG PO TABS: 12.5000 mg | ORAL_TABLET | Freq: Two times a day (BID) | ORAL | Status: DC

## 2015-12-07 MED ORDER — GUAIFENESIN 100 MG/5ML PO SOLN: 5.0000 mL | ORAL | Status: AC | PRN

## 2015-12-07 NOTE — Telephone Encounter (Signed)
TCM w/ Philomena CourseK Thompson in [facility] 12/21 @ 8am

## 2015-12-07 NOTE — Progress Notes (Signed)
Patient Name: Edgar [name] Date of Encounter: 12/07/2015     Principal Problem:   Acute systolic congestive heart failure (HCC) Active Problems:   Hypertension   Acute kidney injury superimposed on chronic kidney disease (HCC)   Anemia in chronic renal disease   Acute congestive heart failure (HCC)   Acute heart failure (HCC)   Dyspnea    SUBJECTIVE Breathing back to baseline. Very eager to leave.   CURRENT MEDS . amLODipine  5 mg Oral Daily  . aspirin EC  81 mg Oral Daily  . carvedilol  6.25 mg Oral BID WC  . enoxaparin (LOVENOX) injection  40 mg Subcutaneous Q24H  . hydrALAZINE  25 mg Oral TID  . isosorbide mononitrate  30 mg Oral Daily  . potassium chloride  40 mEq Oral Daily  . tamsulosin  0.4 mg Oral Daily    OBJECTIVE  Filed Vitals:   12/06/15 1230 12/06/15 1818 12/06/15 2012 12/07/15 0541  BP: 139/84 152/94 154/100 170/100  Pulse: 84 95 86 84  Temp: 97.8 F (36.6 C)  98 F (36.7 C) 98 F (36.7 C)  TempSrc: Oral  Oral Oral  Resp: Height:      Weight:    213 lb 9.6 oz (96.888 kg)  SpO2: 100%  97% 96%    Intake/Output Summary (Last 24 hours) at 12/07/15 0724 Last data filed at 12/07/15 0636  Gross per 24 hour  Intake   1200 ml  Output   1200 ml  Net      0 ml   Filed Weights   12/05/15 0539 12/06/15 0617 12/07/15 0541  Weight: 212 lb (96.163 kg) 213 lb 8 oz (96.843 kg) 213 lb 9.6 oz (96.888 kg)    PHYSICAL EXAM  General: Pleasant, NAD. Neuro: Alert and oriented X 3. Moves all extremities spontaneously. Psych: Normal affect. HEENT:  Normal  Neck: Supple without bruits or JVD. Lungs:  Resp regular and unlabored, CTA. Heart: RRR no s3, s4, or murmurs. Abdomen: Soft, non-tender, non-distended, BS + x 4.  Extremities: No clubbing, cyanosis or edema. DP/PT/Radials 2+ and equal bilaterally.  Accessory Clinical Findings  CBC  Recent Labs  12/06/15 0320  WBC 11.0*  HGB 14.0  HCT 42.3  MCV 82.9  PLT 274   Basic  Metabolic Panel  Recent Labs  12/06/15 0320 12/07/15 0549  NA 134* 134*  K 4.2 4.1  CL 102 102  CO2 20* 22  GLUCOSE 101* 92  BUN 37* 42*  CREATININE 3.22* 3.01*  CALCIUM 8.9 9.2     TELE NSR a  Short run of SVT, NSVT and idioventricular rhythm   Radiology/Studies  Dg Chest 2 View  12/03/2015  CLINICAL DATA:  Acute onset of shortness of breath, worse when lying down. Initial encounter. EXAM: CHEST  2 VIEW COMPARISON:  None. FINDINGS: The lungs are well-aerated. Vascular congestion is noted. There is no evidence of focal opacification, pleural effusion or pneumothorax. The heart is borderline normal in size. No acute osseous abnormalities are seen. IMPRESSION: Vascular congestion noted; lungs remain grossly clear. Electronically Signed   By: Roanna Raider M.D.   On: 12/03/2015 00:09   US Renal  12/03/2015  CLINICAL DATA:  Renal failure EXAM: RENAL / URINARY TRACT ULTRASOUND COMPLETE COMPARISON:  None. FINDINGS: Right Kidney: Length: 9.3 cm. Echogenicity and renal cortical thickness are within normal limits. No mass, perinephric fluid, or hydronephrosis visualized. No sonographically demonstrable calculus or ureterectasis. Left Kidney: Length: 9.0 cm. Echogenicity and  renal cortical thickness are within normal limits. No perinephric fluid or hydronephrosis visualized. There is a cyst in the mid right kidney measuring 1.0 x 1.0 x 0.9 cm. There is no sonographically demonstrable calculus or ureterectasis. Bladder: Appears normal for degree of bladder distention. IMPRESSION: Small left renal cyst. No obstructing foci in either kidney. Renal cortical thickness and echogenicity are within normal limits bilaterally. Kidneys are rather small, a finding that may be seen with medical renal disease. Electronically Signed   By: Bretta Bang III M.D.   On: 12/03/2015 10:22    2D ECHO: 12/04/2015 LV EF: 25- 30% Study Conclusions - Left ventricle: The cavity size was mildly dilated. There  was moderate concentric hypertrophy. Systolic function was severely reduced. The estimated ejection fraction was in the range of 25% to 30%. Diffuse hypokinesis. Features are consistent with a pseudonormal left ventricular filling pattern, with concomitant abnormal relaxation and increased filling pressure (grade 2 diastolic dysfunction). - Aortic valve: Trileaflet; mildly thickened, mildly calcified leaflets. - Mitral valve: There was mild regurgitation. - Left atrium: The atrium was moderately dilated. - Atrial septum: There was increased thickness of the septum, consistent with lipomatous hypertrophy   ASSESSMENT AND PLAN Mr. Orourke is a 54 y.o.malewith a history of HTN, CVA, CKD III, and reported history of heart failure (2000) who was admitted on 12/02/15 with 5 days of progressing DOE and orthopnea.   Acute combined S/D CHF- ECHO 12/05/15 with EF 25- 30% with diffuse HK, G2DD, mild MR, mod LA dilation - systolic dysfunction likely due to hypertensive CM.  - started on lasix  IV BID and kidney function initially improved but then worsened and IV lasix stopped. Net neg 2.6L. Weights appear inaccurate.  - started on Coreg 6.25 mg BID and hydralazine  TID with imdur . Amlodipine decreased from 10 to . Not a candidate for ACE/ARB due to CKD   CKD: elevated Cr to 3.07 on admission. It initially improved with diuresis and now bumped to 3.22. IV lasix stopped. Creat 3.01 today. His baseline is around 2.8 I will start lasix  po qd/kdur daily and have him seen in the office in a week for appt and BMET - renal US this admission with small kidneys c/w medical renal disease.  - [facility]  HTN- BPs not well controlled. Started on norvasc  daily on admission. As above I have started Coreg 6.25 mg BID and hydralazine  TID with imdur  in the setting of LV dysfunction.  I decreased amlodipine from 10 to  to  make more room. I will increase Coreg to 12.5mg  BID today.  Troponin elevation - mild, trending down (0.18--> 0.13--> 0.12). Nonspecific finding in setting of severely elevated bp's, renal disease, clinical CHF. No CP. Likely demand ischemia.   AIVR- noted on tele. He alternates between NSR and AIVR  NSVT/Short run of SVT-  I increased BB as above.   Hypokalemia- repleted.   Dispo- patient doesn't have insurance and is not willing to stay another day in the hospital.  I will make him an appointment with me next Wednesday, but i doubt he will be complaint in follow up care.   SignedJanetta Hora PA-C  Pager [phone]      I have examined the patient and reviewed assessment and plan and discussed with patient.  Agree with above as stated.  I stressed the importance of compliance with his medications. Keeping his blood pressure down is  the only way his heart function will improve and he will be able to preserve his remaining renal function. I explained him that if he has high blood pressure in the future, this will eventually lead to end-stage renal disease and the need for dialysis. Can send home on hydralazine, nitrates, beta blocker and oral Lasix.  He has follow-up in the cardiology clinic. He will need electrolytes checked at that time. Hopefully, he will be compliant with his prescribed treatment.  Ugonna Keirsey S.

## 2015-12-07 NOTE — Progress Notes (Signed)
[facility]. Abelino DerrickB Gerome Kokesh Ey[facility]are Medical GroupRN,MHA,BSN [phone]

## 2015-12-07 NOTE — Progress Notes (Signed)
While in the room to do ReDS vest reading I again reviewed with [name] the HF recommendations for home.  I emphasized the importance of daily weights and when call the physician.  We also discussed a low sodium diet and high sodium foods to avoid.  He realizes that this will be a challenge "on the road".  I gave him some examples of better food choices while traveling.  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  I have encouraged him to go to his follow-up appt so that he can stay well and out of the hospital.  He does say that [facility]  He does acknowledge that he plans to take prescribed medications "every day".  I encouraged him to call me after discharge with questions or concerns related to his HF.

## 2015-12-07 NOTE — Discharge Summary (Signed)
Physician Discharge Summary  Edgar [name] IHK:[id number] DOB: 1961-01-09 DOA: 12/02/2015  PCP: [name], MD  Admit date: 12/02/2015 Discharge date: 12/07/2015  Time spent: 45 minutes  Recommendations for Outpatient Follow-up:  Patient will be discharged to home.  Patient will need to follow up with primary care provider within one week of discharge.  Patient will also need to follow up with cardiology on 12/13/2015 at 8am, repeat CBC and BMP at that time.  Patient will also need to follow up with nephrology at the [facility]. Patient should continue medications as prescribed.  Patient should follow a heart healthy diet.   Discharge Diagnoses:  Acute combined systolic and diastolic congestive heart failure Mild elevated troponin Uncontrolled hypertension Acute on chronic kidney disease, stage III -IV Anemia of chronic disease Hypokalemia  Discharge Condition: Stable  Diet recommendation: Heart healthy  Filed Weights   12/05/15 0539 12/06/15 0617 12/07/15 0541  Weight: 96.163 kg (212 lb) 96.843 kg (213 lb 8 oz) 96.888 kg (213 lb 9.6 oz)    History of present illness:  on 12/03/2015 by Dr. Joen Laura Ronny [name] is a 54 y.o. 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  The patient reports feeling well until about 5 days ago when he started to develop dyspnea with exertion, dyspnea with lying down relieved with sitting up, dry cough and frequent nocturia. This was in the absence of chest discomfort/pressure, weight gain, or leg swelling. Today, the symptoms were so bad he came to the ER. Of note, per CareEverywhere, the patient was seen in [facility] three months ago for dyspnea, was recommended for admission at that time for CHF and refused.   In the ED tonight, the patient  had acute on chronic renal failure (Cr 3.07 mg/dL from 7.56 mg/dL in September at [facility]), minimally elevated troponin and d-dimer, no leukocytosis, and BNP 1236 pg/mL. A chest x-ray showed pulmonary edema, but the exam was not impressive for fluid overload. TRH were asked to admit for presumed CHF and AoCKI.  Hospital Course:  Acute combined systolic and diastolic congestive heart failure -Cardiology consulted and appreciated -Upon admission, BNP 1236 -Patient states he had heart failure 17 years ago -Continue to monitor daily weights, intake and output -Urine output over the last 24 hours 1200cc -Weight down 11 kg -Echocardiogram EF of 25-30%, grade 2 diastolic dysfunction -Continue aspirin -No ACEI/ARB due to kidney failure -Cardiology started Coreg 6.25mg  BID, hydralazine  TID, Imdur  daily. Amlodipine decreased to  daily.  -Will discharge with Lasix  PO daily. -Not sure that patient completely understands the severity of his disease process. Had long discussion with him regarding compliance of medications and doctor's appointments.  Mild Elevated troponin  -Likely demand ischemia -Troponin 0.13, trended down to 0.12 -Continue aspirin -Echocardiogram as above  Uncontrolled hypertension -Continue Norvasc, Coreg, hydralazine, imdur, Lasix   Acute on Chronic kidney disease, stage III-IV -Renal ultrasound showed small left renal cyst, no obstructing foci, ? Medical renal disease  -Creatinine 3.01 (spoke with patient, he stated his "baseline was 2.8" and he follows with a nephrologist through [facility]) -Monitor BMP in one week  Anemia of chronic renal disease -Hemoglobin stable, currently 14  Hypokalemia -Resolved, Potassium 4.1 -Likely complicated by diuresis  -Magnesium 2  -Continue to monitor BMP  Procedures  Echocardiogram Renal ultrasound  Consults  Cardiology   Discharge Exam: Filed Vitals:   12/07/15 1043 12/07/15 1130  BP:  158/99 142/88  Pulse:  79  Temp:    Resp:     Exam  General: Well developed, well nourished, NAD  HEENT: NCAT, mucous membranes moist.   Cardiovascular: S1 S2 auscultated, RRR, no murmurs  Respiratory: Clear to auscultation bilaterally   Abdomen: Soft, nontender, nondistended, + bowel sounds  Extremities: warm dry without cyanosis clubbing or edema  Neuro: AAOx3, nonfocal  Psych: Normal affect and demeanor, pleasant  Discharge Instructions      Discharge Instructions    Discharge instructions    Complete by:  As directed   Patient will be discharged to home.  Patient will need to follow up with primary care provider within one week of discharge.  Patient will also need to follow up with cardiology on 12/13/2015 at 8am, repeat CBC and BMP at that time.  Patient will also [facility]. Patient should continue medications as prescribed.  Patient should follow a heart healthy diet.            Medication List    STOP taking these medications        labetalol 300 MG tablet  Commonly known as:  NORMODYNE      TAKE these medications        amLODipine 5 MG tablet  Commonly known as:  NORVASC  Take 1 tablet (5 mg total) by mouth daily.     ASPIRIN EC LO-DOSE 81 MG EC tablet  Generic drug:  aspirin  Take 81 mg by mouth daily.     carvedilol 12.5 MG tablet  Commonly known as:  COREG  Take 1 tablet (12.5 mg total) by mouth 2 (two) times daily with a meal.     furosemide 20 MG tablet  Commonly known as:  LASIX  Take 3 tablets (60 mg total) by mouth daily.     guaiFENesin 100 MG/5ML Soln  Commonly known as:  ROBITUSSIN  Take 5 mLs (100 mg total) by mouth every 4 (four) hours as needed for cough or to loosen phlegm.     hydrALAZINE 25 MG tablet  Commonly known as:  APRESOLINE  Take 1 tablet (25 mg total) by mouth 3 (three) times daily.     isosorbide mononitrate 30 MG 24 hr tablet  Commonly known as:  IMDUR  Take 1 tablet (30 mg  total) by mouth daily.     potassium chloride SA 15 MEQ tablet  Commonly known as:  KLOR-CON M15  Take 2 tablets (30 mEq total) by mouth daily.     tamsulosin 0.4 MG Caps capsule  Commonly known as:  FLOMAX  Take 0.4 mg by mouth daily.       No Known Allergies Follow-up Information    Follow up with Janetta HoraHOMPSON, KATHRYN R, PA-C On 12/13/2015.   Specialties:  Cardiology, Radiology   Why:  @ 8am   Contact information:   [address] [place] [address] [phone]       [facility] with [facility]UEL, MD. Schedule an appointment as soon as possible for a visit in 1 week.   Specialty:  Internal Medicine   Why:  Hospital follow-up   Contact information:   [address] [place] [address]        The results of significant diagnostics from this hospitalization (including imaging, microbiology, ancillary and laboratory) are listed below  for reference.    Significant Diagnostic Studies: Dg Chest 2 View  12/03/2015  CLINICAL DATA:  Acute onset of shortness of breath, worse when lying down. Initial encounter. EXAM: CHEST  2 VIEW COMPARISON:  None. FINDINGS: The lungs are well-aerated. Vascular congestion is noted. There is no evidence of focal opacification, pleural effusion or pneumothorax. The heart is borderline normal in size. No acute osseous abnormalities are seen. IMPRESSION: Vascular congestion noted; lungs remain grossly clear. Electronically Signed   By: Roanna Raider M.D.   On: 12/03/2015 00:09   US Renal  12/03/2015  CLINICAL DATA:  Renal failure EXAM: RENAL / URINARY TRACT ULTRASOUND COMPLETE COMPARISON:  None. FINDINGS: Right Kidney: Length: 9.3 cm. Echogenicity and renal cortical thickness are within normal limits. No mass, perinephric fluid, or hydronephrosis visualized. No sonographically demonstrable calculus or ureterectasis. Left Kidney: Length: 9.0 cm. Echogenicity and renal cortical thickness are within normal limits. No perinephric fluid  or hydronephrosis visualized. There is a cyst in the mid right kidney measuring 1.0 x 1.0 x 0.9 cm. There is no sonographically demonstrable calculus or ureterectasis. Bladder: Appears normal for degree of bladder distention. IMPRESSION: Small left renal cyst. No obstructing foci in either kidney. Renal cortical thickness and echogenicity are within normal limits bilaterally. Kidneys are rather small, a finding that may be seen with medical renal disease. Electronically Signed   By: Bretta Bang III M.D.   On: 12/03/2015 10:22    Microbiology: No results found for this or any previous visit (from the past 240 hour(s)).   Labs: Basic Metabolic Panel:  Recent Labs Lab 12/02/15 2340 12/03/15 0527 12/04/15 0418 12/05/15 0333 12/06/15 0320 12/07/15 0549  NA 138  --  139 137 134* 134*  K 4.0  --  3.3* 3.3* 4.2 4.1  CL 108  --  103 100* 102 102  CO2 23  --  25 25 20* 22  GLUCOSE 112*  --  91 108* 101* 92  BUN 32*  --  29* 30* 37* 42*  CREATININE 3.07* 3.07* 3.06* 2.91* 3.22* 3.01*  CALCIUM 8.6*  --  9.2 9.2 8.9 9.2  MG  --  2.0  --   --   --   --    Liver Function Tests: No results for input(s): AST, ALT, ALKPHOS, BILITOT, PROT, ALBUMIN in the last 168 hours. No results for input(s): LIPASE, AMYLASE in the last 168 hours. No results for input(s): AMMONIA in the last 168 hours. CBC:  Recent Labs Lab 12/02/15 2340 12/03/15 0527 12/06/15 0320  WBC 8.3 8.6 11.0*  NEUTROABS 5.1  --   --   HGB 12.5* 12.3* 14.0  HCT 37.8* 36.2* 42.3  MCV 84.2 84.2 82.9  PLT 231 234 274   Cardiac Enzymes:  Recent Labs Lab 12/02/15 2340 12/03/15 0527 12/03/15 1034 12/03/15 1715  TROPONINI 0.18* 0.13* 0.13* 0.12*   BNP: BNP (last 3 results)  Recent Labs  12/02/15 2340  BNP 1236.3*    ProBNP (last 3 results) No results for input(s): PROBNP in the last 8760 hours.  CBG: No results for input(s): GLUCAP in the last 168 hours.     SignedEdsel Petrin  [facility] 12/07/2015, 11:50 AM

## 2015-12-07 NOTE — Progress Notes (Signed)
ReDS Vest Discharge Study  Results of ReDS reading is 32.  Your patient is ok for discharge.    Thank You   The research team   

## 2015-12-13 ENCOUNTER — Encounter: Payer: Non-veteran care | Admitting: Physician Assistant

## 2015-12-14 ENCOUNTER — Telehealth (HOSPITAL_COMMUNITY): Payer: Self-pay | Admitting: Surgery

## 2015-12-14 NOTE — Telephone Encounter (Signed)
Heart Failure Nurse Navigator Post Discharge Telephone Call  I called and spoke with [name] regarding his recent hospitalization.  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  [place] and [facility] to [facility].  He tells me that his BP was "up" yesterday--150's/100's and that he has not checked it yet today.  He has not been weighing because he has not had a scale available "on the road".  He says that he has been taking his medications as prescribed daily.  I encouraged him to keep his appt for tomorrow, keep checking his BP and call me back with any concerns or questions related to his HF.

## 2016-04-02 ENCOUNTER — Telehealth: Payer: Self-pay | Admitting: *Deleted

## 2016-04-02 NOTE — Telephone Encounter (Signed)
I called patient to follow-up with [email or web address]  7646-month phone visit.I left patient a message on answering service to call me back. I checked [facility] hospital records and patient has not been re-hospitalized.

## 2016-07-09 ENCOUNTER — Other Ambulatory Visit: Payer: Self-pay | Admitting: Student

## 2016-07-09 DIAGNOSIS — R945 Abnormal results of liver function studies: Secondary | ICD-10-CM

## 2016-07-09 DIAGNOSIS — R7989 Other specified abnormal findings of blood chemistry: Secondary | ICD-10-CM

## 2016-07-09 DIAGNOSIS — E8809 Other disorders of plasma-protein metabolism, not elsewhere classified: Secondary | ICD-10-CM

## 2016-07-09 DIAGNOSIS — K760 Fatty (change of) liver, not elsewhere classified: Secondary | ICD-10-CM

## 2016-07-09 DIAGNOSIS — B182 Chronic viral hepatitis C: Secondary | ICD-10-CM

## 2016-07-11 ENCOUNTER — Ambulatory Visit: Payer: Medicare Other

## 2017-10-13 ENCOUNTER — Emergency Department (HOSPITAL_BASED_OUTPATIENT_CLINIC_OR_DEPARTMENT_OTHER): Payer: Medicare Other

## 2017-10-13 ENCOUNTER — Encounter (HOSPITAL_BASED_OUTPATIENT_CLINIC_OR_DEPARTMENT_OTHER): Payer: Self-pay | Admitting: *Deleted

## 2017-10-13 ENCOUNTER — Inpatient Hospital Stay (HOSPITAL_BASED_OUTPATIENT_CLINIC_OR_DEPARTMENT_OTHER)
Admission: EM | Admit: 2017-10-13 | Discharge: 2017-10-28 | DRG: 286 | Disposition: A | Discharge status: Home Health | Payer: Medicare Other | Attending: Cardiovascular Disease | Admitting: Cardiovascular Disease

## 2017-10-13 DIAGNOSIS — Z95810 Presence of automatic (implantable) cardiac defibrillator: Secondary | ICD-10-CM

## 2017-10-13 DIAGNOSIS — I429 Cardiomyopathy, unspecified: Secondary | ICD-10-CM | POA: Diagnosis present

## 2017-10-13 DIAGNOSIS — R7989 Other specified abnormal findings of blood chemistry: Secondary | ICD-10-CM | POA: Diagnosis present

## 2017-10-13 DIAGNOSIS — R040 Epistaxis: Secondary | ICD-10-CM | POA: Diagnosis not present

## 2017-10-13 DIAGNOSIS — E1122 Type 2 diabetes mellitus with diabetic chronic kidney disease: Secondary | ICD-10-CM | POA: Diagnosis present

## 2017-10-13 DIAGNOSIS — I5021 Acute systolic (congestive) heart failure: Secondary | ICD-10-CM | POA: Diagnosis not present

## 2017-10-13 DIAGNOSIS — I4891 Unspecified atrial fibrillation: Secondary | ICD-10-CM | POA: Diagnosis present

## 2017-10-13 DIAGNOSIS — E669 Obesity, unspecified: Secondary | ICD-10-CM | POA: Diagnosis present

## 2017-10-13 DIAGNOSIS — E611 Iron deficiency: Secondary | ICD-10-CM | POA: Diagnosis present

## 2017-10-13 DIAGNOSIS — N289 Disorder of kidney and ureter, unspecified: Secondary | ICD-10-CM

## 2017-10-13 DIAGNOSIS — I471 Supraventricular tachycardia: Secondary | ICD-10-CM | POA: Diagnosis present

## 2017-10-13 DIAGNOSIS — N179 Acute kidney failure, unspecified: Secondary | ICD-10-CM | POA: Diagnosis not present

## 2017-10-13 DIAGNOSIS — Z6828 Body mass index (BMI) 28.0-28.9, adult: Secondary | ICD-10-CM

## 2017-10-13 DIAGNOSIS — I130 Hypertensive heart and chronic kidney disease with heart failure and stage 1 through stage 4 chronic kidney disease, or unspecified chronic kidney disease: Secondary | ICD-10-CM | POA: Diagnosis present

## 2017-10-13 DIAGNOSIS — R5381 Other malaise: Secondary | ICD-10-CM | POA: Diagnosis present

## 2017-10-13 DIAGNOSIS — I272 Pulmonary hypertension, unspecified: Secondary | ICD-10-CM | POA: Diagnosis present

## 2017-10-13 DIAGNOSIS — I340 Nonrheumatic mitral (valve) insufficiency: Secondary | ICD-10-CM | POA: Diagnosis present

## 2017-10-13 DIAGNOSIS — I69351 Hemiplegia and hemiparesis following cerebral infarction affecting right dominant side: Secondary | ICD-10-CM

## 2017-10-13 DIAGNOSIS — I472 Ventricular tachycardia, unspecified: Secondary | ICD-10-CM

## 2017-10-13 DIAGNOSIS — I361 Nonrheumatic tricuspid (valve) insufficiency: Secondary | ICD-10-CM | POA: Diagnosis not present

## 2017-10-13 DIAGNOSIS — I509 Heart failure, unspecified: Secondary | ICD-10-CM

## 2017-10-13 DIAGNOSIS — Z452 Encounter for adjustment and management of vascular access device: Secondary | ICD-10-CM

## 2017-10-13 DIAGNOSIS — N184 Chronic kidney disease, stage 4 (severe): Secondary | ICD-10-CM | POA: Diagnosis present

## 2017-10-13 DIAGNOSIS — I4901 Ventricular fibrillation: Secondary | ICD-10-CM | POA: Diagnosis present

## 2017-10-13 DIAGNOSIS — I5043 Acute on chronic combined systolic (congestive) and diastolic (congestive) heart failure: Secondary | ICD-10-CM | POA: Diagnosis not present

## 2017-10-13 DIAGNOSIS — R0609 Other forms of dyspnea: Secondary | ICD-10-CM | POA: Diagnosis present

## 2017-10-13 DIAGNOSIS — E213 Hyperparathyroidism, unspecified: Secondary | ICD-10-CM | POA: Diagnosis present

## 2017-10-13 DIAGNOSIS — R40241 Glasgow coma scale score 13-15, unspecified time: Secondary | ICD-10-CM | POA: Diagnosis present

## 2017-10-13 DIAGNOSIS — D631 Anemia in chronic kidney disease: Secondary | ICD-10-CM | POA: Diagnosis present

## 2017-10-13 DIAGNOSIS — I10 Essential (primary) hypertension: Secondary | ICD-10-CM

## 2017-10-13 DIAGNOSIS — I493 Ventricular premature depolarization: Secondary | ICD-10-CM | POA: Diagnosis present

## 2017-10-13 DIAGNOSIS — I5023 Acute on chronic systolic (congestive) heart failure: Secondary | ICD-10-CM | POA: Diagnosis present

## 2017-10-13 DIAGNOSIS — Z9181 History of falling: Secondary | ICD-10-CM

## 2017-10-13 DIAGNOSIS — K5900 Constipation, unspecified: Secondary | ICD-10-CM | POA: Diagnosis present

## 2017-10-13 DIAGNOSIS — Z7982 Long term (current) use of aspirin: Secondary | ICD-10-CM

## 2017-10-13 DIAGNOSIS — E871 Hypo-osmolality and hyponatremia: Secondary | ICD-10-CM | POA: Diagnosis not present

## 2017-10-13 DIAGNOSIS — Z79899 Other long term (current) drug therapy: Secondary | ICD-10-CM

## 2017-10-13 LAB — CBC WITH DIFFERENTIAL/PLATELET
BASOS ABS: 0 10*3/uL (ref 0.0–0.1)
BASOS PCT: 0 %
EOS PCT: 1 %
Eosinophils Absolute: 0.1 10*3/uL (ref 0.0–0.7)
HCT: 39.4 % (ref 39.0–52.0)
Hemoglobin: 13.4 g/dL (ref 13.0–17.0)
Lymphocytes Relative: 16 %
Lymphs Abs: 1.4 10*3/uL (ref 0.7–4.0)
MCH: 26.9 pg (ref 26.0–34.0)
MCHC: 34 g/dL (ref 30.0–36.0)
MCV: 79 fL (ref 78.0–100.0)
MONO ABS: 0.6 10*3/uL (ref 0.1–1.0)
MONOS PCT: 7 %
Neutro Abs: 6.8 10*3/uL (ref 1.7–7.7)
Neutrophils Relative %: 76 %
PLATELETS: 243 10*3/uL (ref 150–400)
RBC: 4.99 MIL/uL (ref 4.22–5.81)
RDW: 16.6 % — AB (ref 11.5–15.5)
WBC: 8.9 10*3/uL (ref 4.0–10.5)

## 2017-10-13 LAB — BRAIN NATRIURETIC PEPTIDE: B NATRIURETIC PEPTIDE 5: 3149.4 pg/mL — AB (ref 0.0–100.0)

## 2017-10-13 LAB — MAGNESIUM: Magnesium: 2.2 mg/dL (ref 1.7–2.4)

## 2017-10-13 LAB — COMPREHENSIVE METABOLIC PANEL
ALBUMIN: 3.4 g/dL — AB (ref 3.5–5.0)
ALT: 158 U/L — ABNORMAL HIGH (ref 17–63)
ANION GAP: 13 (ref 5–15)
AST: 45 U/L — AB (ref 15–41)
Alkaline Phosphatase: 78 U/L (ref 38–126)
BILIRUBIN TOTAL: 2.1 mg/dL — AB (ref 0.3–1.2)
BUN: 53 mg/dL — AB (ref 6–20)
CHLORIDE: 103 mmol/L (ref 101–111)
CO2: 17 mmol/L — ABNORMAL LOW (ref 22–32)
Calcium: 8.7 mg/dL — ABNORMAL LOW (ref 8.9–10.3)
Creatinine, Ser: 3.4 mg/dL — ABNORMAL HIGH (ref 0.61–1.24)
GFR calc Af Amer: 21 mL/min — ABNORMAL LOW (ref >60)
GFR, EST NON AFRICAN AMERICAN: 18 mL/min — AB (ref >60)
GLUCOSE: 124 mg/dL — AB (ref 65–99)
POTASSIUM: 3.7 mmol/L (ref 3.5–5.1)
Sodium: 133 mmol/L — ABNORMAL LOW (ref 135–145)
TOTAL PROTEIN: 7.6 g/dL (ref 6.5–8.1)

## 2017-10-13 LAB — MRSA PCR SCREENING: MRSA BY PCR: NEGATIVE

## 2017-10-13 LAB — TROPONIN I
TROPONIN I: 0.11 ng/mL — AB (ref <0.03)
TROPONIN I: 0.09 ng/mL — AB (ref <0.03)
TROPONIN I: 0.08 ng/mL — AB (ref <0.03)

## 2017-10-13 LAB — GLUCOSE, CAPILLARY
Glucose-Capillary: 99 mg/dL (ref 65–99)
Glucose-Capillary: 118 mg/dL — ABNORMAL HIGH (ref 65–99)

## 2017-10-13 MED ORDER — HYDRALAZINE HCL 50 MG PO TABS
50.0000 mg | ORAL_TABLET | Freq: Three times a day (TID) | ORAL | Status: DC
  Administered 2017-10-13 – 2017-10-14 (×2): 50 mg via ORAL
  Filled 2017-10-13 (×5): qty 1

## 2017-10-13 MED ORDER — SODIUM CHLORIDE 0.9% FLUSH
3.0000 mL | Freq: Two times a day (BID) | INTRAVENOUS | Status: DC
  Administered 2017-10-13 – 2017-10-27 (×23): 3 mL via INTRAVENOUS

## 2017-10-13 MED ORDER — SODIUM CHLORIDE 0.9% FLUSH: 3.0000 mL | INTRAVENOUS | Status: DC | PRN

## 2017-10-13 MED ORDER — FUROSEMIDE 10 MG/ML IJ SOLN
15.0000 mg/h | INTRAVENOUS | Status: DC
  Administered 2017-10-13 – 2017-10-14 (×2): 15 mg/h via INTRAVENOUS
  Filled 2017-10-13 (×3): qty 25

## 2017-10-13 MED ORDER — NITROGLYCERIN IN D5W 200-5 MCG/ML-% IV SOLN
0.0000 ug/min | INTRAVENOUS | Status: DC
  Administered 2017-10-13: 100 ug/min via INTRAVENOUS
  Administered 2017-10-13: 5 ug/min via INTRAVENOUS
  Administered 2017-10-14: 100 ug/min via INTRAVENOUS
  Filled 2017-10-13 (×3): qty 250

## 2017-10-13 MED ORDER — FUROSEMIDE 10 MG/ML IJ SOLN
40.0000 mg | Freq: Once | INTRAMUSCULAR | Status: AC
  Administered 2017-10-13: 40 mg via INTRAVENOUS
  Filled 2017-10-13: qty 4

## 2017-10-13 MED ORDER — SODIUM CHLORIDE 0.9 % IV SOLN: 250.0000 mL | INTRAVENOUS | Status: DC | PRN

## 2017-10-13 MED ORDER — AMIODARONE HCL IN DEXTROSE 360-4.14 MG/200ML-% IV SOLN
60.0000 mg/h | INTRAVENOUS | Status: AC
  Administered 2017-10-13 (×2): 60 mg/h via INTRAVENOUS
  Filled 2017-10-13 (×2): qty 200

## 2017-10-13 MED ORDER — AMIODARONE HCL 200 MG PO TABS: 400.0000 mg | ORAL_TABLET | Freq: Two times a day (BID) | ORAL | Status: DC

## 2017-10-13 MED ORDER — ASPIRIN EC 81 MG PO TBEC
81.0000 mg | DELAYED_RELEASE_TABLET | Freq: Every day | ORAL | Status: DC
  Administered 2017-10-13 – 2017-10-28 (×16): 81 mg via ORAL
  Filled 2017-10-13 (×16): qty 1

## 2017-10-13 MED ORDER — TAMSULOSIN HCL 0.4 MG PO CAPS
0.4000 mg | ORAL_CAPSULE | Freq: Every day | ORAL | Status: DC
  Administered 2017-10-13 – 2017-10-27 (×14): 0.4 mg via ORAL
  Filled 2017-10-13 (×15): qty 1

## 2017-10-13 MED ORDER — SODIUM CHLORIDE 0.9 % IV SOLN
Freq: Once | INTRAVENOUS | Status: AC
  Administered 2017-10-13: 13:00:00 via INTRAVENOUS

## 2017-10-13 MED ORDER — HYDRALAZINE HCL 20 MG/ML IJ SOLN
20.0000 mg | Freq: Once | INTRAMUSCULAR | Status: AC
  Administered 2017-10-13: 20 mg via INTRAVENOUS
  Filled 2017-10-13: qty 1

## 2017-10-13 MED ORDER — HEPARIN (PORCINE) IN NACL 100-0.45 UNIT/ML-% IJ SOLN
1450.0000 [IU]/h | INTRAMUSCULAR | Status: DC
  Administered 2017-10-13 – 2017-10-14 (×2): 1450 [IU]/h via INTRAVENOUS
  Filled 2017-10-13 (×4): qty 250

## 2017-10-13 MED ORDER — ACETAMINOPHEN 325 MG PO TABS: 650.0000 mg | ORAL_TABLET | ORAL | Status: DC | PRN

## 2017-10-13 MED ORDER — FUROSEMIDE 10 MG/ML IJ SOLN
80.0000 mg | Freq: Once | INTRAMUSCULAR | Status: AC
  Administered 2017-10-13: 80 mg via INTRAVENOUS
  Filled 2017-10-13: qty 8

## 2017-10-13 MED ORDER — AMLODIPINE BESYLATE 5 MG PO TABS
5.0000 mg | ORAL_TABLET | Freq: Every day | ORAL | Status: DC
Start: 2017-10-13 — End: 2017-10-13
  Administered 2017-10-13: 5 mg via ORAL
  Filled 2017-10-13: qty 1

## 2017-10-13 MED ORDER — HEPARIN BOLUS VIA INFUSION
5500.0000 [IU] | Freq: Once | INTRAVENOUS | Status: AC
  Administered 2017-10-13: 5500 [IU] via INTRAVENOUS
  Filled 2017-10-13: qty 5500

## 2017-10-13 MED ORDER — POTASSIUM CHLORIDE CRYS ER 20 MEQ PO TBCR
30.0000 meq | EXTENDED_RELEASE_TABLET | Freq: Two times a day (BID) | ORAL | Status: DC
  Administered 2017-10-13 – 2017-10-14 (×3): 30 meq via ORAL
  Filled 2017-10-13 (×4): qty 1

## 2017-10-13 MED ORDER — ONDANSETRON HCL 4 MG/2ML IJ SOLN: 4.0000 mg | Freq: Four times a day (QID) | INTRAMUSCULAR | Status: DC | PRN

## 2017-10-13 MED ORDER — AMIODARONE HCL IN DEXTROSE 360-4.14 MG/200ML-% IV SOLN
30.0000 mg/h | INTRAVENOUS | Status: DC
  Administered 2017-10-13 – 2017-10-20 (×15): 30 mg/h via INTRAVENOUS
  Filled 2017-10-13 (×19): qty 200

## 2017-10-13 MED ORDER — LABETALOL HCL 5 MG/ML IV SOLN
10.0000 mg | INTRAVENOUS | Status: DC | PRN
  Administered 2017-10-20 – 2017-10-21 (×2): 10 mg via INTRAVENOUS
  Filled 2017-10-13 (×3): qty 4

## 2017-10-13 MED ORDER — AMIODARONE HCL 200 MG PO TABS: 400.0000 mg | ORAL_TABLET | Freq: Every day | ORAL | Status: DC

## 2017-10-13 MED ORDER — AMIODARONE LOAD VIA INFUSION
150.0000 mg | Freq: Once | INTRAVENOUS | Status: AC
  Administered 2017-10-13: 150 mg via INTRAVENOUS
  Filled 2017-10-13: qty 83.34

## 2017-10-13 NOTE — H&P (Addendum)
Cardiology History and Physical:   Patient ID: Edgar [name]; [id number]; 09/07/1961   Admit date: 10/13/2017 Date of Consult: 10/13/2017  Primary Care Provider: Sondra ComeWoods, Samuel, MD Primary Cardiologist: seen by Dr. Wyline MoodBranch 2016; [facility] [place] Primary Electrophysiologist:  Gerarda FractionGabriel Breuer implanted ICD in [place] 09/25/2016   Patient Profile:   Edgar PianChristopher [name] is a 56 y.o. male with a hx of systolic CHF who is being seen today for the acute HF exacerbation.  History of Present Illness:   [name] [facility] ED [facility] with about 3 weeks of worsening exertional dyspnea, culminating in dyspnea at rest in last 2-3 days. There has been parallel worsening of bilateral leg edema and he reports muscle cramps "all over". He denies, fever, cough, hemoptysis, unilateral calf tenderness, new neurological symptoms palpitations, syncope or ICD discharges.  He fell walking into a store about 3 weeks ago. He is not sure whether he passed out. He fell against the curb and hurt his ribcage,but then got up and went home. This occurred on a Sunday and probably coincides with an episode of polymorphic VT w CL 140 ms that deteriorated to very fine VF before successful ICD shock on Sep 30. He did not realize that he was shocked by his device.  He denies missing cardiac meds, but my impression is that he is less than compliant with sodium restriction. He does not weigh regularly.  He is an Investment banker, operationalarmy veteran who [facility]. 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 [facility] w acute CHF and acute renal insufficiency. He denies angina. He has severe HTN for >30 years, CKD stage 3B-4, but does not have DM or smoking history. An ICD was implanted in 2017 in [place], after he presented with syncope and had a lot of NSVT on telemetry. Prior to that "cardiac catheterization  done on 10/4(2017) showed showed nonischemic cardiomyopathy, EF 25%. AICD was placed on 10/4(2017)".  Discharge weight in 2016, 213 lb. He reports usual weight 219 lb, does not weigh regularly. Bedscale weight in ICU today 203 lb, but he has evidence of gross volume overload.  Creatinine during 2014 admission was around 3.0, today 3.40. In [place] one year ago creat was 3.3-4.0. BNP in 2016 was around 1000, in 2017 [place] it was 1600, now around 3000. Troponin I is marginally elevated, as it was in 2016.  Device interrogation - which is the first performed either in person or remotely since November 2017, shows normal battery and leads (Medtronic Visia AF single chamber, dual coil). He has had numerous episodes of VT, 3 of which have led to successful therapy: successful ATP in June 2018, successful 35J shock on Sept 14, successful 35J shock on sept 30 - all events during daytime hours, all appropriate for very rapid VT/VF (CL down to 140 ms). Has also had a few self terminated non sustained events, many of them today, as well as many episodes of paroxysmal atrial tachycardia with 1:1 AV conduction that appear to be appropriately discriminated by wavelet morphology analysis.  Optivol shows volume accumulation that began around Sept 1. Interestingly, over the last 10 days or so,fluid accumulation seems to be improving and is approaching baseline.  Past Medical History:  Diagnosis Date  . CHF (congestive heart failure) (HCC)    Years ago, treated and no recurrence  . CKD (chronic kidney disease)   .  Hypertension   . Stroke [facility])    2014, residual R sided deficits    Past Surgical History:  Procedure Laterality Date  . ANKLE SURGERY Right 2008  . CARDIAC DEFIBRILLATOR PLACEMENT       Home Medications:  Prior to Admission medications   Medication Sig Start Date End Date Taking? Authorizing Provider  amLODipine (NORVASC) 5 MG tablet Take 1 tablet (5 mg total) by mouth daily. 12/07/15  Yes  Mikhail, Nita Sells, DO  aspirin (ASPIRIN EC LO-DOSE) 81 MG EC tablet Take 81 mg by mouth daily. 03/23/13  Yes [provider]  carvedilol (COREG) 12.5 MG tablet Take 1 tablet (12.5 mg total) by mouth 2 (two) times daily with a meal. 12/07/15  Yes Mikhail, [place], DO  furosemide (LASIX) 20 MG tablet Take 3 tablets (60 mg total) by mouth daily. 12/07/15  Yes Mikhail, Nita Sells, DO  hydrALAZINE (APRESOLINE) 25 MG tablet Take 1 tablet (25 mg total) by mouth 3 (three) times daily. 12/07/15  Yes Mikhail, Nita Sells, DO  isosorbide mononitrate (IMDUR) 30 MG 24 hr tablet Take 1 tablet (30 mg total) by mouth daily. 12/07/15  Yes Mikhail, Nita Sells, DO  potassium chloride (KLOR-CON M15) 15 MEQ tablet Take 2 tablets (30 mEq total) by mouth daily. 12/07/15  Yes Mikhail, Nita Sells, DO  tamsulosin (FLOMAX) 0.4 MG CAPS capsule Take 0.4 mg by mouth daily.   Yes [provider]  guaiFENesin (ROBITUSSIN) 100 MG/5ML SOLN Take 5 mLs (100 mg total) by mouth every 4 (four) hours as needed for cough or to loosen phlegm. 12/07/15   Edsel Petrin, DO    Inpatient Medications: Scheduled Meds:  Continuous Infusions: . nitroGLYCERIN 10 mcg/min (10/13/17 1342)   PRN Meds:   Allergies:   No Known Allergies  Social History:   Social History   Social History  . Marital status: Single    Spouse name: N/A  . Number of children: N/A  . Years of education: N/A   Occupational History  . Not on file.   Social History Main Topics  . Smoking status: Never Smoker  . Smokeless tobacco: Never Used  . Alcohol use No  . Drug use: No  . Sexual activity: Not on file   Other Topics Concern  . Not on file   Social History Narrative  . No narrative on file    Family History:    Family History  Problem Relation Age of Onset  . Congestive Heart Failure Mother   . Hypertension Mother   . Kidney disease Mother   . Arrhythmia Father        has Pacemaker or ICD     ROS:  Please see the history of present  illness.  ROS  All other ROS reviewed and negative.     Physical Exam/Data:   Vitals:   10/13/17 1330 10/13/17 1334 10/13/17 1400 10/13/17 1532  BP: (!) 138/126  (!) 141/107   Pulse: (!) 115 (!) 117 (!) 115   Resp: (!) 36 20 13   Temp:    (!) 97.3 F (36.3 C)  TempSrc:    Oral  SpO2: 92% 97% 95%   Weight:    203 lb 7.8 oz (92.3 kg)  Height:        Intake/Output Summary (Last 24 hours) at 10/13/17 1551 Last data filed at 10/13/17 1500  Gross per 24 hour  Intake                0 ml  Output  375 ml  Net             -375 ml   Filed Weights   10/13/17 1024 10/13/17 1532  Weight: 238 lb (108 kg) 203 lb 7.8 oz (92.3 kg)   Body mass index is 28.38 kg/m.  General:  Well nourished, well developed, in moderate acute distress HEENT: normal Lymph: no adenopathy Neck: 10 cm JVD no additional HJR Endocrine:  No thryomegaly Vascular: No carotid bruits; FA pulses 2+ bilaterally without bruits  Cardiac:  normal S1, S2; RRR; no diastolic murmur, 2/6 apical holosystolic murmur, loud S3/summation gallop Lungs:  clear to auscultation bilaterally, no wheezing, rhonchi or rales  Abd: soft, nontender, no hepatomegaly  Ext: 3+ soft pitting edema to mid thighs, no genital edema Musculoskeletal:  No deformities, Subtle reduction in R grip strength compared to L. BLE strength normal and equal, Skin: warm and dry  Neuro:  CNs 2-12 intact, no focal abnormalities noted Psych:  Normal affect   EKG:  The EKG was personally reviewed and demonstrates:  Sinus tachycardia with PACs and frequent and polymorphic PVCs, nonspecific minor IVCD (QRS 114 ms), QTc 505 ms. Telemetry:  Telemetry was personally reviewed and demonstrates:  Sinus tachycardia, frequent PVCs and couplets  Relevant CV Studies: ECHO 12/04/2015 - Left ventricle: The cavity size was mildly dilated. There was   moderate concentric hypertrophy. Systolic function was severely   reduced. The estimated ejection fraction was in  the range of 25%   to 30%. Diffuse hypokinesis. Features are consistent with a   pseudonormal left ventricular filling pattern, with concomitant   abnormal relaxation and increased filling pressure (grade 2   diastolic dysfunction). - Aortic valve: Trileaflet; mildly thickened, mildly calcified   leaflets. - Mitral valve: There was mild regurgitation. - Left atrium: The atrium was moderately dilated. - Atrial septum: There was increased thickness of the septum,   consistent with lipomatous hypertrophy.   Laboratory Data:  Chemistry Recent Labs Lab 10/13/17 1045  NA 133*  K 3.7  CL 103  CO2 17*  GLUCOSE 124*  BUN 53*  CREATININE 3.40*  CALCIUM 8.7*  GFRNONAA 18*  GFRAA 21*  ANIONGAP 13     Recent Labs Lab 10/13/17 1045  PROT 7.6  ALBUMIN 3.4*  AST 45*  ALT 158*  ALKPHOS 78  BILITOT 2.1*   Hematology Recent Labs Lab 10/13/17 1045  WBC 8.9  RBC 4.99  HGB 13.4  HCT 39.4  MCV 79.0  MCH 26.9  MCHC 34.0  RDW 16.6*  PLT 243   Cardiac Enzymes Recent Labs Lab 10/13/17 1045  TROPONINI 0.11*   No results for input(s): TROPIPOC in the last 168 hours.  BNP Recent Labs Lab 10/13/17 1045  BNP 3,149.4*    DDimer No results for input(s): DDIMER in the last 168 hours.  Radiology/Studies:  Dg Chest 2 View  Result Date: 10/13/2017 CLINICAL DATA:  Shortness of breath following fall several weeks ago with pain, initial encounter EXAM: CHEST  2 VIEW COMPARISON:  12/02/2015 FINDINGS: Cardiac shadow is mildly enlarged but stable. Defibrillator is now seen in satisfactory position. The lungs are well aerated bilaterally. No focal infiltrate or sizable effusion is seen. No definitive rib abnormality is seen. Degenerative change of the thoracic spine is noted. IMPRESSION: No active cardiopulmonary disease. Electronically Signed   By: Alcide Clever M.D.   On: 10/13/2017 10:45    Assessment and Plan:   1. Acute on chronic HF: grossly volume overloaded despite lower  weight than previously  documented, with a lot of leg edema and severe JVD. Diurese aggressively, while carefully monitoring K,MG and creatinine levels. His DBP is very high and he will benefit from aggressive reduction in afterload with hydralazine nitrates. Hold beta blockers during acute exacerbation - but may need to restart early if BP remains high. No RAAS inhibitors due to advanced renal failure. Nonischemic CMP based on report from cath in [place] 2017. The CXR is remarkably clear despite the marked dyspnea and gross evidence of right heart volume overload. 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 Check D-dimer, if abnormal V/Q scan when he can cooperate with the scan. 2. VT: repeated appropriate device therapy. ATP in June was not during CHF exacerbation, but recent shocks were preceded by evidence of volume retention by Optivol. Rhythm is very unstable on monitor. Will add amiodarone and request EP consultation. Keep K > 4, Mg> 2, but watch for excessive repletion with renal failure. 3. HTN: severe elevation in DBP, prefer use of direct vasodilators and diuretics. 4. CKD 4: current renal parameters similar to records from [place] one year ago.  Critically ill patient with severe, life threatening illness. Complicated decision making, consultation w subspecialty.65 minutes spent with the patient. For questions or updates, please contact.   [facility] Please consult [email or web address] for contact info under Cardiology/STEMI.   Signed, Thurmon Fair, MD  10/13/2017 3:51 PM   Thurmon Fair, MD, [facility] [phone] office [phone] pager

## 2017-10-13 NOTE — ED Notes (Signed)
ED Provider at bedside. 

## 2017-10-13 NOTE — Progress Notes (Signed)
  Amiodarone Drug - Drug Interaction Consult Note  Recommendations: Monitor for hypokalemia with diuretics and correct appropriately.   Amiodarone is metabolized by the cytochrome P450 system and therefore has the potential to cause many drug interactions. Amiodarone has an average plasma half-life of 50 days (range 20 to 100 days).   There is potential for drug interactions to occur several weeks or months after stopping treatment and the onset of drug interactions may be slow after initiating amiodarone.   []  Statins: Increased risk of myopathy. Simvastatin- restrict dose to 20mg  daily. Other statins: counsel patients to report any muscle pain or weakness immediately.  []  Anticoagulants: Amiodarone can increase anticoagulant effect. Consider warfarin dose reduction. Patients should be monitored closely and the dose of anticoagulant altered accordingly, remembering that amiodarone levels take several weeks to stabilize.  []  Antiepileptics: Amiodarone can increase plasma concentration of phenytoin, the dose should be reduced. Note that small changes in phenytoin dose can result in large changes in levels. Monitor patient and counsel on signs of toxicity.  []  Beta blockers: increased risk of bradycardia, AV block and myocardial depression. Sotalol - avoid concomitant use.  []   Calcium channel blockers (diltiazem and verapamil): increased risk of bradycardia, AV block and myocardial depression.  []   Cyclosporine: Amiodarone increases levels of cyclosporine. Reduced dose of cyclosporine is recommended.  []  Digoxin dose should be halved when amiodarone is started.  [x]  Diuretics: increased risk of cardiotoxicity if hypokalemia occurs.  []  Oral hypoglycemic agents (glyburide, glipizide, glimepiride): increased risk of hypoglycemia. Patient's glucose levels should be monitored closely when initiating amiodarone therapy.   []  Drugs that prolong the QT interval:  Torsades de pointes risk may be  increased with concurrent use - avoid if possible.  Monitor QTc, also keep magnesium/potassium WNL if concurrent therapy can't be avoided. Marland Kitchen. Antibiotics: e.g. fluoroquinolones, erythromycin. . Antiarrhythmics: e.g. quinidine, procainamide, disopyramide, sotalol. . Antipsychotics: e.g. phenothiazines, haloperidol.  . Lithium, tricyclic antidepressants, and methadone.  Thank You,  Edgar [name], Edgar [name]  10/13/2017 4:54 PM
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
## 2017-10-13 NOTE — Progress Notes (Signed)
CRITICAL VALUE ALERT  Critical Value:  Troponin 0.09   Date & Time Notied:  10/13/17 @ 2000  Provider Notified: md aware  Orders Received/Actions taken: no new orders, trending down

## 2017-10-13 NOTE — ED Notes (Addendum)
Pt given ice chips with verbal approval from EDP

## 2017-10-13 NOTE — ED Provider Notes (Signed)
[facility] 39M MEDICAL ICU Provider Note   CSN: [id number] Arrival date & time: 10/13/17  4098     History   Chief Complaint Chief Complaint  Patient presents with  . Muscle Pain  . Shortness of Breath    HPI Edgar [name] is a 56 y.o. male.  The history is provided by the patient and the spouse. No language interpreter was used.  Muscle Pain  Associated symptoms include shortness of breath.  Shortness of Breath    Edgar [name] is a 56 y.o. male who presents to the Emergency Department complaining of sob.  Hx is provided by the patient and his wife.  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  He has been experiencing increased SOB, leg swelling and diffuse muscle cramping for the last three weeks.  He reports sob at rest and with exertion.  He did have a fall three weeks ago and hit his right side but does not currently have any pain there.  No fever, chest pain, cough, abdominal pain, vomiting, diarrhea, change in urinary output.  He has a hx/o CVA, CHF, defibrillator placement.  He takes his medications but takes them every night instead of 2-3 times daily as prescribed.  Sxs are severe, constant, worsening.   Past Medical History:  Diagnosis Date  . CHF (congestive heart failure) (HCC)    Years ago, treated and no recurrence  . CKD (chronic kidney disease)   . Hypertension   . Stroke [facility])    2014, residual R sided deficits    Patient Active Problem List   Diagnosis Date Noted  . Acute CHF (congestive heart failure) (HCC) 10/13/2017  . Acute on chronic combined systolic (congestive) and diastolic (congestive) heart failure (HCC) 10/13/2017  . Hypokalemia   . Dyspnea   . Acute systolic congestive heart failure (HCC) 12/03/2015  . Hypertension 12/03/2015  . Acute kidney injury superimposed on chronic kidney disease (HCC) 12/03/2015  . Anemia in chronic renal disease 12/03/2015  . Acute congestive heart failure  (HCC) 12/03/2015  . Acute heart failure (HCC) 12/03/2015    Past Surgical History:  Procedure Laterality Date  . ANKLE SURGERY Right 2008  . CARDIAC DEFIBRILLATOR PLACEMENT         Home Medications    Prior to Admission medications   Medication Sig Start Date End Date Taking? Authorizing Provider  amLODipine (NORVASC) 5 MG tablet Take 1 tablet (5 mg total) by mouth daily. 12/07/15  Yes Mikhail, Nita Sells, DO  aspirin (ASPIRIN EC LO-DOSE) 81 MG EC tablet Take 81 mg by mouth daily. 03/23/13  Yes [provider]  carvedilol (COREG) 12.5 MG tablet Take 1 tablet (12.5 mg total) by mouth 2 (two) times daily with a meal. 12/07/15  Yes Mikhail, [place], DO  furosemide (LASIX) 20 MG tablet Take 3 tablets (60 mg total) by mouth daily. 12/07/15  Yes Mikhail, Nita Sells, DO  hydrALAZINE (APRESOLINE) 25 MG tablet Take 1 tablet (25 mg total) by mouth 3 (three) times daily. 12/07/15  Yes Mikhail, Nita Sells, DO  isosorbide mononitrate (IMDUR) 30 MG 24 hr tablet Take 1 tablet (30 mg total) by mouth daily. 12/07/15  Yes Mikhail, Nita Sells, DO  potassium chloride (KLOR-CON M15) 15 MEQ tablet Take 2 tablets (30 mEq total) by mouth daily. 12/07/15  Yes Mikhail, Nita Sells, DO  tamsulosin (FLOMAX) 0.4 MG CAPS capsule Take 0.4 mg by mouth daily.   Yes [provider]  guaiFENesin (ROBITUSSIN) 100 MG/5ML SOLN Take  5 mLs (100 mg total) by mouth every 4 (four) hours as needed for cough or to loosen phlegm. 12/07/15   Edsel Petrin, DO    Family History Family History  Problem Relation Age of Onset  . Congestive Heart Failure Mother   . Hypertension Mother   . Kidney disease Mother   . Arrhythmia Father        has Pacemaker or ICD    Social History Social History  Substance Use Topics  . Smoking status: Never Smoker  . Smokeless tobacco: Never Used  . Alcohol use No     Allergies   Patient has no known allergies.   Review of Systems Review of Systems  Respiratory: Positive for  shortness of breath.   All other systems reviewed and are negative.    Physical Exam Updated Vital Signs BP (!) 140/122   Pulse (!) 120   Temp (!) 97.3 F (36.3 C) (Oral)   Resp (!) 29   Ht 5\' 11"  (1.803 m)   Wt 92.3 kg (203 lb 7.8 oz)   SpO2 100%   BMI 28.38 kg/m   Physical Exam  Constitutional: He is oriented to person, place, and time. He appears well-developed and well-nourished. He appears distressed.  HENT:  Head: Normocephalic and atraumatic.  Neck: JVD present.  Cardiovascular: Regular rhythm.   No murmur heard. tachycardic  Pulmonary/Chest: Breath sounds normal.  tachypnea  Abdominal: Soft. There is no tenderness. There is no rebound and no guarding.  Musculoskeletal: He exhibits no tenderness.  3-4+ pitting edema to BLE  Neurological: He is alert and oriented to person, place, and time.  Skin: Skin is warm and dry.  Psychiatric: He has a normal mood and affect. His behavior is normal.  Nursing note and vitals reviewed.    ED Treatments / Results  Labs (all labs ordered are listed, but only abnormal results are displayed) Labs Reviewed  BRAIN NATRIURETIC PEPTIDE - Abnormal; Notable for the following:       Result Value   B Natriuretic Peptide 3,149.4 (*)    All other components within normal limits  TROPONIN I - Abnormal; Notable for the following:    Troponin I 0.11 (*)    All other components within normal limits  CBC WITH DIFFERENTIAL/PLATELET - Abnormal; Notable for the following:    RDW 16.6 (*)    All other components within normal limits  COMPREHENSIVE METABOLIC PANEL - Abnormal; Notable for the following:    Sodium 133 (*)    CO2 17 (*)    Glucose, Bld 124 (*)    BUN 53 (*)    Creatinine, Ser 3.40 (*)    Calcium 8.7 (*)    Albumin 3.4 (*)    AST 45 (*)    ALT 158 (*)    Total Bilirubin 2.1 (*)    GFR calc non Af Amer 18 (*)    GFR calc Af Amer 21 (*)    All other components within normal limits  MRSA PCR SCREENING  GLUCOSE, CAPILLARY   HIV ANTIBODY (ROUTINE TESTING)  TROPONIN I  TROPONIN I  TROPONIN I    EKG  EKG Interpretation  Date/Time:  Monday October 13 2017 10:21:06 EDT Ventricular Rate:  116 PR Interval:    QRS Duration: 117 QT Interval:  305 QTC Calculation: 424 R Axis:   113 Text Interpretation:  Sinus tachycardia Multiform ventricular premature complexes LAE, consider biatrial enlargement Nonspecific intraventricular conduction delay Probable lateral infarct, age indeterminate Confirmed by Tilden Fossa (  [id number]) on 10/13/2017 10:29:11 AM Also confirmed by Tilden Fossaees, Elmo Shumard [id number]), editor Madalyn RobEverhart, Marilyn [phone]([id number])  on 10/13/2017 10:34:54 AM       Radiology Dg Chest 2 View  Result Date: 10/13/2017 CLINICAL DATA:  Shortness of breath following fall several weeks ago with pain, initial encounter EXAM: CHEST  2 VIEW COMPARISON:  12/02/2015 FINDINGS: Cardiac shadow is mildly enlarged but stable. Defibrillator is now seen in satisfactory position. The lungs are well aerated bilaterally. No focal infiltrate or sizable effusion is seen. No definitive rib abnormality is seen. Degenerative change of the thoracic spine is noted. IMPRESSION: No active cardiopulmonary disease. Electronically Signed   By: Alcide CleverMark  Lukens M.D.   On: 10/13/2017 10:45    Procedures Procedures (including critical care time) CRITICAL CARE Performed by: Tilden FossaElizabeth Whitney Hillegass   Total critical care time: 35 minutes  Critical care time was exclusive of separately billable procedures and treating other patients.  Critical care was necessary to treat or prevent imminent or life-threatening deterioration.  Critical care was time spent personally by me on the following activities: development of treatment plan with patient and/or surrogate as well as nursing, discussions with consultants, evaluation of patient's response to treatment, examination of patient, obtaining history from patient or surrogate, ordering and performing treatments and  interventions, ordering and review of laboratory studies, ordering and review of radiographic studies, pulse oximetry and re-evaluation of patient's condition.   Medications Ordered in ED Medications  nitroGLYCERIN 50 mg in dextrose 5 % 250 mL (0.2 mg/mL) infusion (30 mcg/min Intravenous Rate/Dose Change 10/13/17 1558)  hydrALAZINE (APRESOLINE) tablet 50 mg (50 mg Oral Given 10/13/17 1616)  labetalol (NORMODYNE,TRANDATE) injection 10 mg (not administered)  furosemide (LASIX) 250 mg in dextrose 5 % 250 mL (1 mg/mL) infusion (15 mg/hr Intravenous New Bag/Given 10/13/17 1616)  tamsulosin (FLOMAX) capsule 0.4 mg (not administered)  amLODipine (NORVASC) tablet 5 mg (not administered)  potassium chloride (K-DUR,KLOR-CON) CR tablet 30 mEq (not administered)  aspirin EC tablet 81 mg (not administered)  sodium chloride flush (NS) 0.9 % injection 3 mL (not administered)  sodium chloride flush (NS) 0.9 % injection 3 mL (not administered)  0.9 %  sodium chloride infusion (not administered)  acetaminophen (TYLENOL) tablet 650 mg (not administered)  ondansetron (ZOFRAN) injection 4 mg (not administered)  amiodarone (NEXTERONE) 1.8 mg/mL load via infusion 150 mg (not administered)  amiodarone (NEXTERONE PREMIX) 360-4.14 MG/200ML-% (1.8 mg/mL) IV infusion (not administered)    Followed by  amiodarone (NEXTERONE PREMIX) 360-4.14 MG/200ML-% (1.8 mg/mL) IV infusion (not administered)  amiodarone (PACERONE) tablet 400 mg (not administered)    Followed by  amiodarone (PACERONE) tablet 400 mg (not administered)  furosemide (LASIX) injection 40 mg (40 mg Intravenous Given 10/13/17 1258)  0.9 %  sodium chloride infusion ( Intravenous Transfusing/Transfer 10/13/17 1411)  hydrALAZINE (APRESOLINE) injection 20 mg (20 mg Intravenous Given 10/13/17 1600)  furosemide (LASIX) injection 80 mg (80 mg Intravenous Given 10/13/17 1600)     Initial Impression / Assessment and Plan / ED Course  I have reviewed the triage  vital signs and the nursing notes.  Pertinent labs & imaging results that were available during my care of the patient were reviewed by me and considered in my medical decision making (see chart for details).    Patient with history of CKD and CHF here with increasing shortness of breath and edema.  He is volume overloaded on examination tachypnea and clear breath sounds bilaterally.  Labs demonstrate stable renal function with elevation in his BNP.  Troponin  is elevated but this is similar to his priors.  Providing Lasix and nitroglycerin gtt.  Cardiology consulted for further management.  Plan to transfer to [facility] for additional treatment.  Patient and wife updated of findings of studies and they are in agreement with treatment plan and admission.  Final Clinical Impressions(s) / ED Diagnoses   Final diagnoses:  None    New Prescriptions Current Discharge Medication List       Tilden Fossa, MD 10/13/17 1640

## 2017-10-13 NOTE — ED Triage Notes (Signed)
Pt reports SOB since fall 3 weeks ago when he hit his right ribs. Reports muscle cramps for 3 weeks. Pt with HR 119 in triage. Reports he has implanted defibrillator x 1 year, never fired.

## 2017-10-13 NOTE — Progress Notes (Signed)
ANTICOAGULATION CONSULT NOTE - Initial Consult  Pharmacy Consult for Heparin Indication: Presumed PE  No Known Allergies  Patient Measurements: Height: 5\' 11"  (180.3 cm) Weight: 203 lb 7.8 oz (92.3 kg) IBW/kg (Calculated) : 75.3 Heparin Dosing Weight: 92.3 kg  Vital Signs: Temp: 97.3 F (36.3 C) (10/22 1532) Temp Source: Oral (10/22 1532) BP: 140/122 (10/22 1600) Pulse Rate: 120 (10/22 1600)  Labs:  Recent Labs  10/13/17 1045  HGB 13.4  HCT 39.4  PLT 243  CREATININE 3.40*  TROPONINI 0.11*    Estimated Creatinine Clearance: 27.5 mL/min (A) (by C-G formula based on SCr of 3.4 mg/dL (H)).   Medical History: Past Medical History:  Diagnosis Date  . CHF (congestive heart failure) (HCC)    Years ago, treated and no recurrence  . CKD (chronic kidney disease)   . Hypertension   . 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 Pharmacy consulted to start Heparin for presumed PE. Hep Wt: 92.3 kg, baseline CBC wnl. No AC PTA noted.   Goal of Therapy:  Heparin level 0.3-0.7 units/ml Monitor platelets by anticoagulation protocol: Yes   Plan:  1. Heparin 5500 unit bolus x 1 2. Start Heparin at a drip rate of 1450 units/hr (14.5 ml/hr) 3. Will continue to monitor for any signs/symptoms of bleeding and will follow up with heparin level in 8 hours   Thank you for allowing pharmacy to be a part of this patient's care.  Georgina PillionElizabeth Levii Hairfield, PharmD, BCPS Clinical Pharmacist Pager: [phone] If after 3:30p, please call main pharmacy at: [phone] 10/13/2017 4:29 PM

## 2017-10-14 ENCOUNTER — Inpatient Hospital Stay (HOSPITAL_COMMUNITY): Payer: Medicare Other

## 2017-10-14 DIAGNOSIS — I5021 Acute systolic (congestive) heart failure: Secondary | ICD-10-CM

## 2017-10-14 DIAGNOSIS — I361 Nonrheumatic tricuspid (valve) insufficiency: Secondary | ICD-10-CM

## 2017-10-14 LAB — CBC
HCT: 37.4 % — ABNORMAL LOW (ref 39.0–52.0)
Hemoglobin: 12.5 g/dL — ABNORMAL LOW (ref 13.0–17.0)
MCH: 26.7 pg (ref 26.0–34.0)
MCHC: 33.4 g/dL (ref 30.0–36.0)
MCV: 79.9 fL (ref 78.0–100.0)
PLATELETS: 232 10*3/uL (ref 150–400)
RBC: 4.68 MIL/uL (ref 4.22–5.81)
RDW: 16.3 % — ABNORMAL HIGH (ref 11.5–15.5)
WBC: 8.4 10*3/uL (ref 4.0–10.5)

## 2017-10-14 LAB — BASIC METABOLIC PANEL
ANION GAP: 12 (ref 5–15)
BUN: 49 mg/dL — ABNORMAL HIGH (ref 6–20)
CALCIUM: 8.4 mg/dL — AB (ref 8.9–10.3)
CHLORIDE: 103 mmol/L (ref 101–111)
CO2: 19 mmol/L — AB (ref 22–32)
Creatinine, Ser: 3.44 mg/dL — ABNORMAL HIGH (ref 0.61–1.24)
GFR calc non Af Amer: 18 mL/min — ABNORMAL LOW (ref >60)
GFR, EST AFRICAN AMERICAN: 21 mL/min — AB (ref >60)
GLUCOSE: 120 mg/dL — AB (ref 65–99)
POTASSIUM: 3.3 mmol/L — AB (ref 3.5–5.1)
Sodium: 134 mmol/L — ABNORMAL LOW (ref 135–145)

## 2017-10-14 LAB — HIV ANTIBODY (ROUTINE TESTING W REFLEX): HIV SCREEN 4TH GENERATION: NONREACTIVE

## 2017-10-14 LAB — TROPONIN I: TROPONIN I: 0.08 ng/mL — AB (ref <0.03)

## 2017-10-14 LAB — ECHOCARDIOGRAM COMPLETE
Height: 71 in
Weight: 3312.19 oz

## 2017-10-14 LAB — HEPARIN LEVEL (UNFRACTIONATED): Heparin Unfractionated: 0.56 IU/mL (ref 0.30–0.70)

## 2017-10-14 MED ORDER — PERFLUTREN LIPID MICROSPHERE
1.0000 mL | INTRAVENOUS | Status: AC | PRN
  Administered 2017-10-14: 5 mL via INTRAVENOUS
  Filled 2017-10-14: qty 10

## 2017-10-14 MED ORDER — ISOSORBIDE MONONITRATE ER 30 MG PO TB24
30.0000 mg | ORAL_TABLET | Freq: Every day | ORAL | Status: DC
  Administered 2017-10-14 – 2017-10-19 (×6): 30 mg via ORAL
  Filled 2017-10-14 (×7): qty 1

## 2017-10-14 MED ORDER — HYDRALAZINE HCL 50 MG PO TABS
75.0000 mg | ORAL_TABLET | Freq: Three times a day (TID) | ORAL | Status: DC
  Administered 2017-10-14 – 2017-10-17 (×6): 75 mg via ORAL
  Filled 2017-10-14 (×9): qty 1

## 2017-10-14 MED ORDER — POTASSIUM CHLORIDE CRYS ER 20 MEQ PO TBCR
60.0000 meq | EXTENDED_RELEASE_TABLET | Freq: Once | ORAL | Status: AC
  Administered 2017-10-14: 60 meq via ORAL
  Filled 2017-10-14: qty 3

## 2017-10-14 MED ORDER — FUROSEMIDE 10 MG/ML IJ SOLN
120.0000 mg | Freq: Three times a day (TID) | INTRAVENOUS | Status: DC
  Administered 2017-10-14 – 2017-10-15 (×3): 120 mg via INTRAVENOUS
  Filled 2017-10-14: qty 12
  Filled 2017-10-14: qty 10
  Filled 2017-10-14: qty 12
  Filled 2017-10-14: qty 10

## 2017-10-14 MED ORDER — HEPARIN SODIUM (PORCINE) 5000 UNIT/ML IJ SOLN
5000.0000 [IU] | Freq: Three times a day (TID) | INTRAMUSCULAR | Status: DC
  Administered 2017-10-14 – 2017-10-15 (×3): 5000 [IU] via SUBCUTANEOUS
  Filled 2017-10-14 (×4): qty 1
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
## 2017-10-14 NOTE — Progress Notes (Signed)
ANTICOAGULATION CONSULT NOTE - Follow Up Consult  Pharmacy Consult for heparin Indication: pulmonary embolus  Labs:  Recent Labs  10/13/17 1045 10/13/17 1831 10/13/17 2219 10/14/17 0203  HGB 13.4  --   --   --   HCT 39.4  --   --   --   PLT 243  --   --   --   HEPARINUNFRC  --   --   --  0.56  CREATININE 3.40*  --   --  3.44*  TROPONINI 0.11* 0.09* 0.08* 0.08*    Assessment/Plan:  56yo male therapeutic on heparin with initial dosing for PE. Will continue gtt at current rate and confirm stable with additional level.   Vernard GamblesVeronda Natalye Kott, PharmD, BCPS  10/14/2017,3:01 AM

## 2017-10-14 NOTE — Progress Notes (Signed)
  Echocardiogram 2D Echocardiogram with definity has been performed.  Edgar [name], Edgar [name] M 10/14/2017, 2:36 PM

## 2017-10-14 NOTE — Progress Notes (Signed)
Advanced Heart Failure Rounding Note   Subjective:    Breathing better today. No orthopnea. Still feels bloated. Remains on NTG drip.    Objective:   Weight Range:  Vital Signs:   Temp:  [96.3 F (35.7 C)-97.4 F (36.3 C)] 96.3 F (35.7 C) (10/23 0820) Pulse Rate:  [44-120] 95 (10/23 0900) Resp:  [0-36] 21 (10/23 0900) BP: (106-144)/(68-128) 116/89 (10/23 0900) SpO2:  [92 %-100 %] 100 % (10/23 0900) Weight:  [92.3 kg (203 lb 7.8 oz)-93.9 kg (207 lb 0.2 oz)] 93.9 kg (207 lb 0.2 oz) (10/23 0500) Last BM Date:  (pta )  Weight change: Filed Weights   10/13/17 1024 10/13/17 1532 10/14/17 0500  Weight: 108 kg (238 lb) 92.3 kg (203 lb 7.8 oz) 93.9 kg (207 lb 0.2 oz)    Intake/Output:   Intake/Output Summary (Last 24 hours) at 10/14/17 1112 Last data filed at 10/14/17 0939  Gross per 24 hour  Intake           955.53 ml  Output             2575 ml  Net         -1619.47 ml     Physical Exam: General:  Lying in bed  No resp difficulty HEENT: normal Neck: supple. JVP to ear. Carotids 2+ bilat; no bruits. No lymphadenopathy or thryomegaly appreciated. Cor: PMI laterally displaced. Regular rate & rhythm. +s3 + 2/6 MR Lungs: clear Abdomen: soft, nontender, + distended. No hepatosplenomegaly. No bruits or masses. Good bowel sounds. Extremities: no cyanosis, clubbing, rash, 3+ edema Neuro: alert & orientedx3, cranial nerves grossly intact. moves all 4 extremities w/o difficulty. Affect pleasant  Telemetry: NSR 90-100 + PVCs Personally reviewed   Labs: Basic Metabolic Panel:  Recent Labs Lab 10/13/17 1045 10/13/17 1831 10/14/17 0203  NA 133*  --  134*  K 3.7  --  3.3*  CL 103  --  103  CO2 17*  --  19*  GLUCOSE 124*  --  120*  BUN 53*  --  49*  CREATININE 3.40*  --  3.44*  CALCIUM 8.7*  --  8.4*  MG  --  2.2  --     Liver Function Tests:  Recent Labs Lab 10/13/17 1045  AST 45*  ALT 158*  ALKPHOS 78  BILITOT 2.1*  PROT 7.6  ALBUMIN 3.4*   No  results for input(s): LIPASE, AMYLASE in the last 168 hours. No results for input(s): AMMONIA in the last 168 hours.  CBC:  Recent Labs Lab 10/13/17 1045 10/14/17 1013  WBC 8.9 8.4  NEUTROABS 6.8  --   HGB 13.4 12.5*  HCT 39.4 37.4*  MCV 79.0 79.9  PLT 243 232    Cardiac Enzymes:  Recent Labs Lab 10/13/17 1045 10/13/17 1831 10/13/17 2219 10/14/17 0203  TROPONINI 0.11* 0.09* 0.08* 0.08*    BNP: BNP (last 3 results)  Recent Labs  10/13/17 1045  BNP 3,149.4*    ProBNP (last 3 results) No results for input(s): PROBNP in the last 8760 hours.    Other results:  Imaging: Dg Chest 2 View  Result Date: 10/13/2017 CLINICAL DATA:  Shortness of breath following fall several weeks ago with pain, initial encounter EXAM: CHEST  2 VIEW COMPARISON:  12/02/2015 FINDINGS: Cardiac shadow is mildly enlarged but stable. Defibrillator is now seen in satisfactory position. The lungs are well aerated bilaterally. No focal infiltrate or sizable effusion is seen. No definitive rib abnormality is seen. Degenerative change of  the thoracic spine is noted. IMPRESSION: No active cardiopulmonary disease. Electronically Signed   By: Alcide Clever M.D.   On: 10/13/2017 10:45      Medications:     Scheduled Medications: . aspirin EC  81 mg Oral Daily  . heparin  5,000 Units Subcutaneous Q8H  . hydrALAZINE  75 mg Oral Q8H  . isosorbide mononitrate  30 mg Oral Daily  . potassium chloride  30 mEq Oral BID  . potassium chloride  60 mEq Oral Once  . sodium chloride flush  3 mL Intravenous Q12H  . tamsulosin  0.4 mg Oral QPC supper     Infusions: . sodium chloride    . amiodarone 30 mg/hr (10/14/17 0834)  . furosemide       PRN Medications:  sodium chloride, acetaminophen, labetalol, ondansetron (ZOFRAN) IV, sodium chloride flush   Patient Profile   56 y/o male with h/o severe HTN, systolic HF with EF 25-30%, DM, CKD 4 admitted with decompensated HF and recent ICD shock.    Assessment/Plan   1. Acute on chronic systolic HF due to NICM  - EF 16-10% by previous echo 9/17 with severe MR - cardiac cath 10/17. No CAD - now admitted with marked volume overload and Class IV HF symptoms in setting of recent VT/VF with ICD shock on 09/21/16.  - respiratory status improved today with diuresis but weight up - change lasix gtt to lasix 120 iv tid - not candidate for ACE/ABR/ARNI/spiro with CKD - no b-blocker with acute decompensation. - wean off IV NTG. Titrate hydralazine and imdur as tolerated. Low threshold for inotropes - will need RHC prior to d/c - given CKD IV only durable long-term option will be consideration of heart-kidney transplantation which we discussed - repeat echo - doubt PE despite long-haul truck driving will stop heparin - place TED hose. - transfer to SDU  2. VT/VF - multiple recent events with appropriate ICD therapies. Frequent PVCs and NSVT on monitor - now on amiodarone and ectopy improved - Keep K > 4.0 Mg 2.0 - No driving  3. Elevated troponin - Not ACS. Due to HF. Cath 10/17 no cad  4. CKD 4 - Creatinine baseline 3.0-3.5. Currently stable. Creatinine 3.44 today Watch with diuresis  5. HTN - Improved. Wean NTG today. Titrate hydralazine/Imdur  6. Severe mitral regurgitation - likely functional MR. - pending repeat echo.  7. Previous CVA - mild R-sided deficits. PT/OT to see as he recovers   Length of Stay: 1  Bensimhon, Daniel MD 10/14/2017, 11:12 AM  Advanced Heart Failure Team Pager [phone] (M-F; 7a - 4p)  Please contact [facility] for night-coverage after hours (4p -7a ) and weekends on [email or web address]

## 2017-10-15 ENCOUNTER — Encounter (HOSPITAL_COMMUNITY)
Admission: EM | Disposition: A | Discharge status: Home Health | Payer: Self-pay | Source: Home / Self Care | Attending: Cardiovascular Disease

## 2017-10-15 DIAGNOSIS — N179 Acute kidney failure, unspecified: Secondary | ICD-10-CM

## 2017-10-15 HISTORY — PX: RIGHT HEART CATH: CATH118263

## 2017-10-15 LAB — MAGNESIUM: MAGNESIUM: 2 mg/dL (ref 1.7–2.4)

## 2017-10-15 LAB — POCT I-STAT 3, VENOUS BLOOD GAS (G3P V)
ACID-BASE DEFICIT: 4 mmol/L — AB (ref 0.0–2.0)
ACID-BASE DEFICIT: 6 mmol/L — AB (ref 0.0–2.0)
Acid-base deficit: 2 mmol/L (ref 0.0–2.0)
BICARBONATE: 18.9 mmol/L — AB (ref 20.0–28.0)
Bicarbonate: 17.2 mmol/L — ABNORMAL LOW (ref 20.0–28.0)
Bicarbonate: 21.5 mmol/L (ref 20.0–28.0)
O2 SAT: 57 %
O2 SAT: 66 %
O2 Saturation: 56 %
PH VEN: 7.413 (ref 7.250–7.430)
PH VEN: 7.414 (ref 7.250–7.430)
PO2 VEN: 29 mmHg — AB (ref 32.0–45.0)
TCO2: 20 mmol/L — AB (ref 22–32)
TCO2: 22 mmol/L (ref 22–32)
TCO2: 18 mmol/L — ABNORMAL LOW (ref 22–32)
pCO2, Ven: 27 mmHg — ABNORMAL LOW (ref 44.0–60.0)
pCO2, Ven: 29.5 mmHg — ABNORMAL LOW (ref 44.0–60.0)
pCO2, Ven: 31.5 mmHg — ABNORMAL LOW (ref 44.0–60.0)
pH, Ven: 7.443 — ABNORMAL HIGH (ref 7.250–7.430)
pO2, Ven: 29 mmHg — CL (ref 32.0–45.0)
pO2, Ven: 32 mmHg (ref 32.0–45.0)

## 2017-10-15 LAB — BASIC METABOLIC PANEL
ANION GAP: 15 (ref 5–15)
BUN: 54 mg/dL — ABNORMAL HIGH (ref 6–20)
CALCIUM: 8.8 mg/dL — AB (ref 8.9–10.3)
CO2: 16 mmol/L — ABNORMAL LOW (ref 22–32)
Chloride: 101 mmol/L (ref 101–111)
Creatinine, Ser: 3.91 mg/dL — ABNORMAL HIGH (ref 0.61–1.24)
GFR, EST AFRICAN AMERICAN: 18 mL/min — AB (ref >60)
GFR, EST NON AFRICAN AMERICAN: 16 mL/min — AB (ref >60)
GLUCOSE: 119 mg/dL — AB (ref 65–99)
POTASSIUM: 4.7 mmol/L (ref 3.5–5.1)
Sodium: 132 mmol/L — ABNORMAL LOW (ref 135–145)

## 2017-10-15 LAB — CBC
HEMATOCRIT: 39.2 % (ref 39.0–52.0)
HEMOGLOBIN: 13.4 g/dL (ref 13.0–17.0)
MCH: 27.6 pg (ref 26.0–34.0)
MCHC: 34.2 g/dL (ref 30.0–36.0)
MCV: 80.7 fL (ref 78.0–100.0)
Platelets: 224 10*3/uL (ref 150–400)
RBC: 4.86 MIL/uL (ref 4.22–5.81)
RDW: 16.2 % — ABNORMAL HIGH (ref 11.5–15.5)
WBC: 9.3 10*3/uL (ref 4.0–10.5)

## 2017-10-15 LAB — PROTIME-INR
INR: 1.32
PROTHROMBIN TIME: 16.3 s — AB (ref 11.4–15.2)

## 2017-10-15 LAB — COOXEMETRY PANEL
Carboxyhemoglobin: 1.4 % (ref 0.5–1.5)
METHEMOGLOBIN: 0.9 % (ref 0.0–1.5)
O2 Saturation: 53.5 %
Total hemoglobin: 11.2 g/dL — ABNORMAL LOW (ref 12.0–16.0)

## 2017-10-15 SURGERY — RIGHT HEART CATH
Anesthesia: LOCAL

## 2017-10-15 MED ORDER — FUROSEMIDE 10 MG/ML IJ SOLN
160.0000 mg | Freq: Three times a day (TID) | INTRAVENOUS | Status: DC
  Administered 2017-10-15 – 2017-10-16 (×3): 160 mg via INTRAVENOUS
  Filled 2017-10-15 (×2): qty 16
  Filled 2017-10-15: qty 10
  Filled 2017-10-15 (×2): qty 16

## 2017-10-15 MED ORDER — SODIUM CHLORIDE 0.9% FLUSH
3.0000 mL | Freq: Two times a day (BID) | INTRAVENOUS | Status: DC
  Administered 2017-10-16 – 2017-10-27 (×19): 3 mL via INTRAVENOUS

## 2017-10-15 MED ORDER — SODIUM CHLORIDE 0.9 % IV SOLN
INTRAVENOUS | Status: DC | PRN
  Administered 2017-10-15: 10 mL/h via INTRAVENOUS

## 2017-10-15 MED ORDER — HEPARIN (PORCINE) IN NACL 2-0.9 UNIT/ML-% IJ SOLN
INTRAMUSCULAR | Status: AC
  Filled 2017-10-15: qty 500

## 2017-10-15 MED ORDER — SODIUM CHLORIDE 0.9 % IV SOLN
INTRAVENOUS | Status: AC | PRN
  Administered 2017-10-15: 3 mL/h via INTRAVENOUS

## 2017-10-15 MED ORDER — ACETAMINOPHEN 325 MG PO TABS: 650.0000 mg | ORAL_TABLET | ORAL | Status: DC | PRN

## 2017-10-15 MED ORDER — ONDANSETRON HCL 4 MG/2ML IJ SOLN: 4.0000 mg | Freq: Four times a day (QID) | INTRAMUSCULAR | Status: DC | PRN

## 2017-10-15 MED ORDER — LIDOCAINE HCL 2 % IJ SOLN
INTRAMUSCULAR | Status: AC
  Filled 2017-10-15: qty 20

## 2017-10-15 MED ORDER — SODIUM CHLORIDE 0.9% FLUSH
3.0000 mL | INTRAVENOUS | Status: DC | PRN
  Administered 2017-10-27: 3 mL via INTRAVENOUS
  Filled 2017-10-15: qty 3

## 2017-10-15 MED ORDER — SODIUM CHLORIDE 0.9 % IV SOLN
INTRAVENOUS | Status: AC | PRN
  Administered 2017-10-15: 10 mL/h via INTRAVENOUS

## 2017-10-15 MED ORDER — HEPARIN (PORCINE) IN NACL 2-0.9 UNIT/ML-% IJ SOLN
INTRAMUSCULAR | Status: AC | PRN
  Administered 2017-10-15: 500 mL

## 2017-10-15 MED ORDER — LIDOCAINE HCL 2 % IJ SOLN
INTRAMUSCULAR | Status: DC | PRN
  Administered 2017-10-15: 10 mL

## 2017-10-15 MED ORDER — SODIUM CHLORIDE 0.9 % IV SOLN: 250.0000 mL | INTRAVENOUS | Status: DC | PRN

## 2017-10-15 MED ORDER — ENOXAPARIN SODIUM 30 MG/0.3ML ~~LOC~~ SOLN
30.0000 mg | SUBCUTANEOUS | Status: DC
  Administered 2017-10-16: 30 mg via SUBCUTANEOUS
  Filled 2017-10-15: qty 0.3

## 2017-10-15 MED ORDER — MILRINONE LACTATE IN DEXTROSE 20-5 MG/100ML-% IV SOLN
0.1250 ug/kg/min | INTRAVENOUS | Status: DC
  Administered 2017-10-15 – 2017-10-18 (×6): 0.25 ug/kg/min via INTRAVENOUS
  Administered 2017-10-19: 0.125 ug/kg/min via INTRAVENOUS
  Administered 2017-10-19: 0.25 ug/kg/min via INTRAVENOUS
  Administered 2017-10-20: 0.125 ug/kg/min via INTRAVENOUS
  Filled 2017-10-15 (×9): qty 100

## 2017-10-15 SURGICAL SUPPLY — 12 items
CATH SWAN GANZ 7F STRAIGHT (CATHETERS) ×2 IMPLANT
COVER PRB 48X5XTLSCP FOLD TPE (BAG) ×1 IMPLANT
COVER PROBE 5X48 (BAG) ×1
HOVERMATT SINGLE USE (MISCELLANEOUS) ×2 IMPLANT
KIT HEART LEFT (KITS) ×2 IMPLANT
PACK CARDIAC CATHETERIZATION (CUSTOM PROCEDURE TRAY) ×2 IMPLANT
SHEATH PINNACLE 7F 10CM (SHEATH) ×2 IMPLANT
SLEEVE REPOSITIONING LENGTH 30 (MISCELLANEOUS) ×2 IMPLANT
TRANSDUCER W/STOPCOCK (MISCELLANEOUS) ×2 IMPLANT
TUBING ART PRESS 72  MALE/FEM (TUBING) ×1
TUBING ART PRESS 72 MALE/FEM (TUBING) ×1 IMPLANT
WIRE EMERALD 3MM-J .025X260CM (WIRE) ×2 IMPLANT

## 2017-10-15 NOTE — Progress Notes (Signed)
Advanced Heart Failure Rounding Note   Subjective:   Yesterday Nitro drip stopped and hydralazine/imdur added. Lasix drip stopped and started on 120 mg IV lasix tid. Sluggish urine output. Weight unchanged.   Over night he was short of breath. Denies CP.;   Objective:   Weight Range:  Vital Signs:   Temp:  [96.3 F (35.7 C)-97.6 F (36.4 C)] 97.5 F (36.4 C) (10/24 0730) Pulse Rate:  [42-107] 107 (10/24 0800) Resp:  [13-31] 18 (10/24 0800) BP: (106-128)/(79-105) 126/95 (10/24 0800) SpO2:  [96 %-100 %] 100 % (10/24 0800) Weight:  [207 lb 10.8 oz (94.2 kg)] 207 lb 10.8 oz (94.2 kg) (10/24 0500) Last BM Date: 10/13/17  Weight change: Filed Weights   10/13/17 1532 10/14/17 0500 10/15/17 0500  Weight: 203 lb 7.8 oz (92.3 kg) 207 lb 0.2 oz (93.9 kg) 207 lb 10.8 oz (94.2 kg)    Intake/Output:   Intake/Output Summary (Last 24 hours) at 10/15/17 0813 Last data filed at 10/15/17 0800  Gross per 24 hour  Intake          1369.45 ml  Output              635 ml  Net           734.45 ml     Physical Exam: General:  Appears fatigued. NAD. In bed.  HEENT: normal Neck: supple. JVP to jaw.  Carotids 2+ bilat; no bruits. No lymphadenopathy or thryomegaly appreciated. Cor: PMI nondisplaced. Regular rate & rhythm. No rubs. +S3.  Lungs: clear Abdomen: soft, nontender, nondistended. No hepatosplenomegaly. No bruits or masses. Good bowel sounds. Extremities: no cyanosis, clubbing, rash, R and LLE 3+ edema Neuro: alert & orientedx3, cranial nerves grossly intact. moves all 4 extremities w/o difficulty. Affect flat   Telemetry: Sinus Tach 100s personally reviewed.   Labs: Basic Metabolic Panel:  Recent Labs Lab 10/13/17 1045 10/13/17 1831 10/14/17 0203 10/15/17 0350  NA 133*  --  134* 132*  K 3.7  --  3.3* 4.7  CL 103  --  103 101  CO2 17*  --  19* 16*  GLUCOSE 124*  --  120* 119*  BUN 53*  --  49* 54*  CREATININE 3.40*  --  3.44* 3.91*  CALCIUM 8.7*  --  8.4* 8.8*    MG  --  2.2  --  2.0    Liver Function Tests:  Recent Labs Lab 10/13/17 1045  AST 45*  ALT 158*  ALKPHOS 78  BILITOT 2.1*  PROT 7.6  ALBUMIN 3.4*   No results for input(s): LIPASE, AMYLASE in the last 168 hours. No results for input(s): AMMONIA in the last 168 hours.  CBC:  Recent Labs Lab 10/13/17 1045 10/14/17 1013 10/15/17 0350  WBC 8.9 8.4 9.3  NEUTROABS 6.8  --   --   HGB 13.4 12.5* 13.4  HCT 39.4 37.4* 39.2  MCV 79.0 79.9 80.7  PLT 243 232 224    Cardiac Enzymes:  Recent Labs Lab 10/13/17 1045 10/13/17 1831 10/13/17 2219 10/14/17 0203  TROPONINI 0.11* 0.09* 0.08* 0.08*    BNP: BNP (last 3 results)  Recent Labs  10/13/17 1045  BNP 3,149.4*    ProBNP (last 3 results) No results for input(s): PROBNP in the last 8760 hours.    Other results:  Imaging: Dg Chest 2 View  Result Date: 10/13/2017 CLINICAL DATA:  Shortness of breath following fall several weeks ago with pain, initial encounter EXAM: CHEST  2 VIEW COMPARISON:  12/02/2015 FINDINGS: Cardiac shadow is mildly enlarged but stable. Defibrillator is now seen in satisfactory position. The lungs are well aerated bilaterally. No focal infiltrate or sizable effusion is seen. No definitive rib abnormality is seen. Degenerative change of the thoracic spine is noted. IMPRESSION: No active cardiopulmonary disease. Electronically Signed   By: Alcide CleverMark  Lukens M.D.   On: 10/13/2017 10:45     Medications:     Scheduled Medications: . aspirin EC  81 mg Oral Daily  . heparin  5,000 Units Subcutaneous Q8H  . hydrALAZINE  75 mg Oral Q8H  . isosorbide mononitrate  30 mg Oral Daily  . potassium chloride  30 mEq Oral BID  . sodium chloride flush  3 mL Intravenous Q12H  . tamsulosin  0.4 mg Oral QPC supper    Infusions: . sodium chloride    . amiodarone 30 mg/hr (10/15/17 0736)  . furosemide Stopped (10/15/17 0215)    PRN Medications: sodium chloride, acetaminophen, labetalol, ondansetron  (ZOFRAN) IV, sodium chloride flush   Patient Profile   56 y/o male with h/o severe HTN, systolic HF with EF 25-30%, DM, CKD 4 admitted with decompensated HF and recent ICD shock.   Assessment/Plan   1. Acute on chronic systolic HF due to NICM  10/15/17 ECHO EF 20% Grade III DD and Mod MR - cardiac cath 10/17. No CAD - Addmitted with marked volume overload and Class IV HF symptoms in setting of recent VT/VF with ICD shock on 09/21/16.  Volume status remains elevated.Sluggish urine output and  Increase lasix drip to 160 mg three times a day. -May to nephrology if we are unable to diurese.   - not candidate for ACE/ABR/ARNI/spiro with CKD - no b-blocker with acute decompensation. 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 VT/VF - multiple recent events with appropriate ICD therapies.  - Continue amio drip 30 mg per hour.  - Keep K > 4.0 Mg 2.0. Stable today.  - No driving  3. Elevated troponin - Not ACS. Due to HF. Cath 10/17 no cad - Possible LHC later this week.   4. CKD 4 - Creatinine baseline 3.0-3.5.  - Creatinine trending up 3.4>3.9   5. HTN Stable on hydralazine 75 mg tid + imdur 30 daily.   6. Severe mitral regurgitation - likely functional MR. -Moderate MR   7. Previous CVA - mild R-sided deficits. PT/OT to see as he recovers   Length of Stay: 2  Amy Clegg NP-C  10/15/2017, 8:13 AM  Advanced Heart Failure Team Pager [phone] (M-F; 7a - 4p)  Please contact [facility] for night-coverage after hours (4p -7a ) and weekends on [email or web address]  Patient seen and examined with Tonye BecketAmy Clegg, NP. We discussed all aspects of the encounter. I agree with the assessment and plan as stated above.   Continues to deteriorate. Not diuresing well. Creatinine worse. Breathing more tenuous. Will take to cath lab this afternoon for PA  catheter. If output low will start inotropes if not will need CVVHD. Prognosis guarded.   Total time spent 35 minutes. Over half that time spent discussing above.   Arvilla MeresBensimhon, Jkayla Spiewak, MD  3:12 PM

## 2017-10-15 NOTE — H&P (View-Only) (Signed)
Advanced Heart Failure Rounding Note   Subjective:   Yesterday Nitro drip stopped and hydralazine/imdur added. Lasix drip stopped and started on 120 mg IV lasix tid. Sluggish urine output. Weight unchanged.   Over night he was short of breath. Denies CP.;   Objective:   Weight Range:  Vital Signs:   Temp:  [96.3 F (35.7 C)-97.6 F (36.4 C)] 97.5 F (36.4 C) (10/24 0730) Pulse Rate:  [42-107] 107 (10/24 0800) Resp:  [13-31] 18 (10/24 0800) BP: (106-128)/(79-105) 126/95 (10/24 0800) SpO2:  [96 %-100 %] 100 % (10/24 0800) Weight:  [207 lb 10.8 oz (94.2 kg)] 207 lb 10.8 oz (94.2 kg) (10/24 0500) Last BM Date: 10/13/17  Weight change: Filed Weights   10/13/17 1532 10/14/17 0500 10/15/17 0500  Weight: 203 lb 7.8 oz (92.3 kg) 207 lb 0.2 oz (93.9 kg) 207 lb 10.8 oz (94.2 kg)    Intake/Output:   Intake/Output Summary (Last 24 hours) at 10/15/17 0813 Last data filed at 10/15/17 0800  Gross per 24 hour  Intake          1369.45 ml  Output              635 ml  Net           734.45 ml     Physical Exam: General:  Appears fatigued. NAD. In bed.  HEENT: normal Neck: supple. JVP to jaw.  Carotids 2+ bilat; no bruits. No lymphadenopathy or thryomegaly appreciated. Cor: PMI nondisplaced. Regular rate & rhythm. No rubs. +S3.  Lungs: clear Abdomen: soft, nontender, nondistended. No hepatosplenomegaly. No bruits or masses. Good bowel sounds. Extremities: no cyanosis, clubbing, rash, R and LLE 3+ edema Neuro: alert & orientedx3, cranial nerves grossly intact. moves all 4 extremities w/o difficulty. Affect flat   Telemetry: Sinus Tach 100s personally reviewed.   Labs: Basic Metabolic Panel:  Recent Labs Lab 10/13/17 1045 10/13/17 1831 10/14/17 0203 10/15/17 0350  NA 133*  --  134* 132*  K 3.7  --  3.3* 4.7  CL 103  --  103 101  CO2 17*  --  19* 16*  GLUCOSE 124*  --  120* 119*  BUN 53*  --  49* 54*  CREATININE 3.40*  --  3.44* 3.91*  CALCIUM 8.7*  --  8.4* 8.8*    MG  --  2.2  --  2.0    Liver Function Tests:  Recent Labs Lab 10/13/17 1045  AST 45*  ALT 158*  ALKPHOS 78  BILITOT 2.1*  PROT 7.6  ALBUMIN 3.4*   No results for input(s): LIPASE, AMYLASE in the last 168 hours. No results for input(s): AMMONIA in the last 168 hours.  CBC:  Recent Labs Lab 10/13/17 1045 10/14/17 1013 10/15/17 0350  WBC 8.9 8.4 9.3  NEUTROABS 6.8  --   --   HGB 13.4 12.5* 13.4  HCT 39.4 37.4* 39.2  MCV 79.0 79.9 80.7  PLT 243 232 224    Cardiac Enzymes:  Recent Labs Lab 10/13/17 1045 10/13/17 1831 10/13/17 2219 10/14/17 0203  TROPONINI 0.11* 0.09* 0.08* 0.08*    BNP: BNP (last 3 results)  Recent Labs  10/13/17 1045  BNP 3,149.4*    ProBNP (last 3 results) No results for input(s): PROBNP in the last 8760 hours.    Other results:  Imaging: Dg Chest 2 View  Result Date: 10/13/2017 CLINICAL DATA:  Shortness of breath following fall several weeks ago with pain, initial encounter EXAM: CHEST  2 VIEW COMPARISON:  12/02/2015 FINDINGS: Cardiac shadow is mildly enlarged but stable. Defibrillator is now seen in satisfactory position. The lungs are well aerated bilaterally. No focal infiltrate or sizable effusion is seen. No definitive rib abnormality is seen. Degenerative change of the thoracic spine is noted. IMPRESSION: No active cardiopulmonary disease. Electronically Signed   By: Alcide CleverMark  Lukens M.D.   On: 10/13/2017 10:45     Medications:     Scheduled Medications: . aspirin EC  81 mg Oral Daily  . heparin  5,000 Units Subcutaneous Q8H  . hydrALAZINE  75 mg Oral Q8H  . isosorbide mononitrate  30 mg Oral Daily  . potassium chloride  30 mEq Oral BID  . sodium chloride flush  3 mL Intravenous Q12H  . tamsulosin  0.4 mg Oral QPC supper    Infusions: . sodium chloride    . amiodarone 30 mg/hr (10/15/17 0736)  . furosemide Stopped (10/15/17 0215)    PRN Medications: sodium chloride, acetaminophen, labetalol, ondansetron  (ZOFRAN) IV, sodium chloride flush   Patient Profile   56 y/o male with h/o severe HTN, systolic HF with EF 25-30%, DM, CKD 4 admitted with decompensated HF and recent ICD shock.   Assessment/Plan   1. Acute on chronic systolic HF due to NICM  10/15/17 ECHO EF 20% Grade III DD and Mod MR - cardiac cath 10/17. No CAD - Addmitted with marked volume overload and Class IV HF symptoms in setting of recent VT/VF with ICD shock on 09/21/16.  Volume status remains elevated.Sluggish urine output and  Increase lasix drip to 160 mg three times a day. -May to nephrology if we are unable to diurese.   - not candidate for ACE/ABR/ARNI/spiro with CKD - no b-blocker with acute decompensation. 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 VT/VF - multiple recent events with appropriate ICD therapies.  - Continue amio drip 30 mg per hour.  - Keep K > 4.0 Mg 2.0. Stable today.  - No driving  3. Elevated troponin - Not ACS. Due to HF. Cath 10/17 no cad - Possible LHC later this week.   4. CKD 4 - Creatinine baseline 3.0-3.5.  - Creatinine trending up 3.4>3.9   5. HTN Stable on hydralazine 75 mg tid + imdur 30 daily.   6. Severe mitral regurgitation - likely functional MR. -Moderate MR   7. Previous CVA - mild R-sided deficits. PT/OT to see as he recovers   Length of Stay: 2  Amy Clegg NP-C  10/15/2017, 8:13 AM  Advanced Heart Failure Team Pager [phone] (M-F; 7a - 4p)  Please contact [facility] for night-coverage after hours (4p -7a ) and weekends on [email or web address]  Patient seen and examined with Tonye BecketAmy Clegg, NP. We discussed all aspects of the encounter. I agree with the assessment and plan as stated above.   Continues to deteriorate. Not diuresing well. Creatinine worse. Breathing more tenuous. Will take to cath lab this afternoon for PA  catheter. If output low will start inotropes if not will need CVVHD. Prognosis guarded.   Total time spent 35 minutes. Over half that time spent discussing above.   Arvilla MeresBensimhon, Dalphine Cowie, MD  3:12 PM

## 2017-10-15 NOTE — Interval H&P Note (Signed)
History and Physical Interval Note:  10/15/2017 3:52 PM  Edgar [name]  has presented today for surgery, with the diagnosis of chf  The various methods of treatment have been discussed with the patient and family. After consideration of risks, benefits and other options for treatment, the patient has consented to  Procedure(s): RIGHT HEART CATH (N/A) as a surgical intervention .  The patient's history has been reviewed, patient examined, no change in status, stable for surgery.  I have reviewed the patient's chart and labs.  Questions were answered to the patient's satisfaction.     Bensimhon, Reuel Boomaniel

## 2017-10-15 NOTE — Progress Notes (Signed)
Progress Note  Patient Name: Edgar PianChristopher [name] Date of Encounter: 10/15/2017  Primary Cardiologist: new  Subjective   Dyspnea about the same. No palpitations  Inpatient Medications    Scheduled Meds: . aspirin EC  81 mg Oral Daily  . heparin  5,000 Units Subcutaneous Q8H  . hydrALAZINE  75 mg Oral Q8H  . isosorbide mononitrate  30 mg Oral Daily  . potassium chloride  30 mEq Oral BID  . sodium chloride flush  3 mL Intravenous Q12H  . tamsulosin  0.4 mg Oral QPC supper   Continuous Infusions: . sodium chloride    . amiodarone 30 mg/hr (10/15/17 0736)  . furosemide Stopped (10/15/17 0215)   PRN Meds: sodium chloride, acetaminophen, labetalol, ondansetron (ZOFRAN) IV, sodium chloride flush   Vital Signs    Vitals:   10/15/17 0600 10/15/17 0700 10/15/17 0730 10/15/17 0800  BP: (!) 117/99 (!) 128/105  (!) 126/95  Pulse: (!) 42 99  (!) 107  Resp: (!) 30 14  18   Temp:   (!) 97.5 F (36.4 C)   TempSrc:   Oral   SpO2: 96% 100%  100%  Weight:      Height:        Intake/Output Summary (Last 24 hours) at 10/15/17 0810 Last data filed at 10/15/17 0800  Gross per 24 hour  Intake          1369.45 ml  Output              635 ml  Net           734.45 ml   Filed Weights   10/13/17 1532 10/14/17 0500 10/15/17 0500  Weight: 203 lb 7.8 oz (92.3 kg) 207 lb 0.2 oz (93.9 kg) 207 lb 10.8 oz (94.2 kg)    Telemetry    nsr/sinus - Personally Reviewed  ECG    none - Personally Reviewed  Physical Exam   GEN: stable appearing middle aged man, no acute distress.   Neck: 8 cm JVD Cardiac: RRR, no murmurs, rubs, or gallops.  Respiratory: scatterd basilar rales. GI: Soft, nontender, non-distended  MS: 2+ peripheral edema; No deformity. Neuro:  Nonfocal  Psych: Normal affect   Labs    Chemistry Recent Labs Lab 10/13/17 1045 10/14/17 0203 10/15/17 0350  NA 133* 134* 132*  K 3.7 3.3* 4.7  CL 103 103 101  CO2 17* 19* 16*  GLUCOSE 124* 120* 119*  BUN 53* 49* 54*    CREATININE 3.40* 3.44* 3.91*  CALCIUM 8.7* 8.4* 8.8*  PROT 7.6  --   --   ALBUMIN 3.4*  --   --   AST 45*  --   --   ALT 158*  --   --   ALKPHOS 78  --   --   BILITOT 2.1*  --   --   GFRNONAA 18* 18* 16*  GFRAA 21* 21* 18*  ANIONGAP 13 12 15      Hematology Recent Labs Lab 10/13/17 1045 10/14/17 1013 10/15/17 0350  WBC 8.9 8.4 9.3  RBC 4.99 4.68 4.86  HGB 13.4 12.5* 13.4  HCT 39.4 37.4* 39.2  MCV 79.0 79.9 80.7  MCH 26.9 26.7 27.6  MCHC 34.0 33.4 34.2  RDW 16.6* 16.3* 16.2*  PLT 243 232 224    Cardiac Enzymes Recent Labs Lab 10/13/17 1045 10/13/17 1831 10/13/17 2219 10/14/17 0203  TROPONINI 0.11* 0.09* 0.08* 0.08*   No results for input(s): TROPIPOC in the last 168 hours.   BNP Recent Labs  Lab 10/13/17 1045  BNP 3,149.4*     DDimer No results for input(s): DDIMER in the last 168 hours.   Radiology    Dg Chest 2 View  Result Date: 10/13/2017 CLINICAL DATA:  Shortness of breath following fall several weeks ago with pain, initial encounter EXAM: CHEST  2 VIEW COMPARISON:  12/02/2015 FINDINGS: Cardiac shadow is mildly enlarged but stable. Defibrillator is now seen in satisfactory position. The lungs are well aerated bilaterally. No focal infiltrate or sizable effusion is seen. No definitive rib abnormality is seen. Degenerative change of the thoracic spine is noted. IMPRESSION: No active cardiopulmonary disease. Electronically Signed   By: Alcide Clever M.D.   On: 10/13/2017 10:45    Cardiac Studies    Patient Profile     56 y.o. male admitted with worsening CHF and volume overload and VT  Assessment & Plan   1. VT - he has had no recurrent VT. He will continue his amiodarone IV and if no more VT, then will switch to oral therapy. 2. Acute on chronic systolic heart failure - his volume status is still overloaded. He will continue his diuretic therapy. I suspect he will need IV milrinone.  3. ICD - his device is working normally. Will follow  For  questions or updates, please contact [facility] Please consult [email or web address] for contact info under Cardiology/STEMI.      Signed, Lewayne Bunting, MD  10/15/2017, 8:10 AM  Patient ID: Edgar [name], male   DOB: 25-Sep-1961, 56 y.o.   MRN: [id number]

## 2017-10-16 ENCOUNTER — Inpatient Hospital Stay (HOSPITAL_COMMUNITY): Payer: Medicare Other

## 2017-10-16 ENCOUNTER — Encounter (HOSPITAL_COMMUNITY): Payer: Self-pay | Admitting: Internal Medicine

## 2017-10-16 DIAGNOSIS — N179 Acute kidney failure, unspecified: Secondary | ICD-10-CM

## 2017-10-16 LAB — COOXEMETRY PANEL
CARBOXYHEMOGLOBIN: 1.1 % (ref 0.5–1.5)
Methemoglobin: 0.9 % (ref 0.0–1.5)
O2 Saturation: 72.9 %
Total hemoglobin: 11.6 g/dL — ABNORMAL LOW (ref 12.0–16.0)

## 2017-10-16 LAB — HEPATIC FUNCTION PANEL
ALT: 72 U/L — AB (ref 17–63)
AST: 18 U/L (ref 15–41)
Albumin: 3 g/dL — ABNORMAL LOW (ref 3.5–5.0)
Alkaline Phosphatase: 63 U/L (ref 38–126)
BILIRUBIN INDIRECT: 0.8 mg/dL (ref 0.3–0.9)
Bilirubin, Direct: 0.4 mg/dL (ref 0.1–0.5)
TOTAL PROTEIN: 6.6 g/dL (ref 6.5–8.1)
Total Bilirubin: 1.2 mg/dL (ref 0.3–1.2)

## 2017-10-16 LAB — URINALYSIS, ROUTINE W REFLEX MICROSCOPIC
Bilirubin Urine: NEGATIVE
GLUCOSE, UA: NEGATIVE mg/dL
Ketones, ur: NEGATIVE mg/dL
Leukocytes, UA: NEGATIVE
Nitrite: NEGATIVE
PH: 5 (ref 5.0–8.0)
Protein, ur: 30 mg/dL — AB
Specific Gravity, Urine: 1.009 (ref 1.005–1.030)

## 2017-10-16 LAB — CBC
HCT: 33.9 % — ABNORMAL LOW (ref 39.0–52.0)
Hemoglobin: 11.4 g/dL — ABNORMAL LOW (ref 13.0–17.0)
MCH: 27 pg (ref 26.0–34.0)
MCHC: 33.6 g/dL (ref 30.0–36.0)
MCV: 80.1 fL (ref 78.0–100.0)
PLATELETS: 208 10*3/uL (ref 150–400)
RBC: 4.23 MIL/uL (ref 4.22–5.81)
RDW: 15.9 % — AB (ref 11.5–15.5)
WBC: 8.2 10*3/uL (ref 4.0–10.5)

## 2017-10-16 LAB — POCT ACTIVATED CLOTTING TIME
ACTIVATED CLOTTING TIME: 136 s
ACTIVATED CLOTTING TIME: 175 s
ACTIVATED CLOTTING TIME: 175 s
ACTIVATED CLOTTING TIME: 202 s
ACTIVATED CLOTTING TIME: 208 s
Activated Clotting Time: 147 seconds
Activated Clotting Time: 164 seconds
Activated Clotting Time: 180 seconds

## 2017-10-16 LAB — FERRITIN: Ferritin: 63 ng/mL (ref 24–336)

## 2017-10-16 LAB — BASIC METABOLIC PANEL
Anion gap: 12 (ref 5–15)
BUN: 58 mg/dL — ABNORMAL HIGH (ref 6–20)
CALCIUM: 8.5 mg/dL — AB (ref 8.9–10.3)
CO2: 20 mmol/L — AB (ref 22–32)
CREATININE: 4.58 mg/dL — AB (ref 0.61–1.24)
Chloride: 101 mmol/L (ref 101–111)
GFR calc non Af Amer: 13 mL/min — ABNORMAL LOW (ref >60)
GFR, EST AFRICAN AMERICAN: 15 mL/min — AB (ref >60)
Glucose, Bld: 105 mg/dL — ABNORMAL HIGH (ref 65–99)
Potassium: 4 mmol/L (ref 3.5–5.1)
Sodium: 133 mmol/L — ABNORMAL LOW (ref 135–145)

## 2017-10-16 LAB — RENAL FUNCTION PANEL
Albumin: 2.9 g/dL — ABNORMAL LOW (ref 3.5–5.0)
Anion gap: 10 (ref 5–15)
BUN: 54 mg/dL — ABNORMAL HIGH (ref 6–20)
CO2: 22 mmol/L (ref 22–32)
Calcium: 8.3 mg/dL — ABNORMAL LOW (ref 8.9–10.3)
Chloride: 100 mmol/L — ABNORMAL LOW (ref 101–111)
Creatinine, Ser: 4.14 mg/dL — ABNORMAL HIGH (ref 0.61–1.24)
GFR calc Af Amer: 17 mL/min — ABNORMAL LOW
GFR calc non Af Amer: 15 mL/min — ABNORMAL LOW
Glucose, Bld: 122 mg/dL — ABNORMAL HIGH (ref 65–99)
Phosphorus: 4.1 mg/dL (ref 2.5–4.6)
Potassium: 3.8 mmol/L (ref 3.5–5.1)
Sodium: 132 mmol/L — ABNORMAL LOW (ref 135–145)

## 2017-10-16 LAB — IRON AND TIBC
Iron: 13 ug/dL — ABNORMAL LOW (ref 45–182)
Saturation Ratios: 5 % — ABNORMAL LOW (ref 17.9–39.5)
TIBC: 286 ug/dL (ref 250–450)
UIBC: 273 ug/dL

## 2017-10-16 LAB — MAGNESIUM: MAGNESIUM: 1.9 mg/dL (ref 1.7–2.4)

## 2017-10-16 MED ORDER — PRISMASOL BGK 4/2.5 32-4-2.5 MEQ/L IV SOLN
INTRAVENOUS | Status: DC
Start: 2017-10-16 — End: 2017-10-20
  Administered 2017-10-16 – 2017-10-20 (×9): via INTRAVENOUS_CENTRAL
  Filled 2017-10-16 (×12): qty 5000

## 2017-10-16 MED ORDER — CHLORHEXIDINE GLUCONATE CLOTH 2 % EX PADS
6.0000 | MEDICATED_PAD | Freq: Every day | CUTANEOUS | Status: DC
  Administered 2017-10-16 – 2017-10-28 (×13): 6 via TOPICAL

## 2017-10-16 MED ORDER — SODIUM CHLORIDE 0.9 % FOR CRRT
INTRAVENOUS_CENTRAL | Status: DC | PRN
  Filled 2017-10-16: qty 1000

## 2017-10-16 MED ORDER — PRISMASOL BGK 4/2.5 32-4-2.5 MEQ/L IV SOLN
INTRAVENOUS | Status: DC
  Administered 2017-10-16 – 2017-10-20 (×11): via INTRAVENOUS_CENTRAL
  Filled 2017-10-16 (×15): qty 5000

## 2017-10-16 MED ORDER — HEPARIN SODIUM (PORCINE) 5000 UNIT/ML IJ SOLN
5000.0000 [IU] | Freq: Three times a day (TID) | INTRAMUSCULAR | Status: DC
  Administered 2017-10-17 – 2017-10-28 (×24): 5000 [IU] via SUBCUTANEOUS
  Filled 2017-10-16 (×25): qty 1

## 2017-10-16 MED ORDER — SODIUM CHLORIDE 0.9% FLUSH
3.0000 mL | Freq: Two times a day (BID) | INTRAVENOUS | Status: DC
  Administered 2017-10-16: 3 mL via INTRAVENOUS

## 2017-10-16 MED ORDER — SODIUM CHLORIDE 0.9 % IV SOLN: INTRAVENOUS | Status: DC

## 2017-10-16 MED ORDER — HEPARIN BOLUS VIA INFUSION (CRRT)
1000.0000 [IU] | INTRAVENOUS | Status: DC | PRN
  Filled 2017-10-16: qty 1000

## 2017-10-16 MED ORDER — HEPARIN SODIUM (PORCINE) 1000 UNIT/ML DIALYSIS
1000.0000 [IU] | INTRAMUSCULAR | Status: DC | PRN
  Administered 2017-10-16 – 2017-10-20 (×2): 4000 [IU] via INTRAVENOUS_CENTRAL
  Filled 2017-10-16: qty 5
  Filled 2017-10-16 (×3): qty 6
  Filled 2017-10-16: qty 4

## 2017-10-16 MED ORDER — SODIUM CHLORIDE 0.9% FLUSH: 3.0000 mL | INTRAVENOUS | Status: DC | PRN

## 2017-10-16 MED ORDER — SODIUM CHLORIDE 0.9% FLUSH: 10.0000 mL | INTRAVENOUS | Status: DC | PRN

## 2017-10-16 MED ORDER — MAGNESIUM SULFATE IN D5W 1-5 GM/100ML-% IV SOLN
1.0000 g | Freq: Once | INTRAVENOUS | Status: AC
  Administered 2017-10-16: 1 g via INTRAVENOUS
  Filled 2017-10-16: qty 100

## 2017-10-16 MED ORDER — SODIUM CHLORIDE 0.9 % IV SOLN: 250.0000 mL | INTRAVENOUS | Status: DC | PRN

## 2017-10-16 MED ORDER — ORAL CARE MOUTH RINSE
15.0000 mL | Freq: Two times a day (BID) | OROMUCOSAL | Status: DC
  Administered 2017-10-17 – 2017-10-19 (×4): 15 mL via OROMUCOSAL

## 2017-10-16 MED ORDER — SODIUM CHLORIDE 0.9% FLUSH
10.0000 mL | Freq: Two times a day (BID) | INTRAVENOUS | Status: DC
  Administered 2017-10-18 – 2017-10-26 (×9): 10 mL
  Administered 2017-10-26: 30 mL
  Administered 2017-10-27 – 2017-10-28 (×2): 10 mL

## 2017-10-16 MED ORDER — ASPIRIN 81 MG PO CHEW: 81.0000 mg | CHEWABLE_TABLET | ORAL | Status: DC

## 2017-10-16 MED ORDER — PRISMASOL BGK 4/2.5 32-4-2.5 MEQ/L IV SOLN
INTRAVENOUS | Status: DC
  Administered 2017-10-16 – 2017-10-20 (×25): via INTRAVENOUS_CENTRAL
  Filled 2017-10-16 (×32): qty 5000

## 2017-10-16 MED ORDER — SODIUM CHLORIDE 0.9 % IJ SOLN
250.0000 [IU]/h | INTRAMUSCULAR | Status: DC
  Administered 2017-10-16: 1000 [IU]/h via INTRAVENOUS_CENTRAL
  Administered 2017-10-16: 250 [IU]/h via INTRAVENOUS_CENTRAL
  Administered 2017-10-16: 1000 [IU]/h via INTRAVENOUS_CENTRAL
  Administered 2017-10-17 (×2): 1900 [IU]/h via INTRAVENOUS_CENTRAL
  Administered 2017-10-17: 1000 [IU]/h via INTRAVENOUS_CENTRAL
  Administered 2017-10-17: 1850 [IU]/h via INTRAVENOUS_CENTRAL
  Administered 2017-10-17: 1300 [IU]/h via INTRAVENOUS_CENTRAL
  Administered 2017-10-18 (×2): 1400 [IU]/h via INTRAVENOUS_CENTRAL
  Administered 2017-10-18: 1600 [IU]/h via INTRAVENOUS_CENTRAL
  Administered 2017-10-18: 1850 [IU]/h via INTRAVENOUS_CENTRAL
  Administered 2017-10-19: 1650 [IU]/h via INTRAVENOUS_CENTRAL
  Administered 2017-10-19: 1450 [IU]/h via INTRAVENOUS_CENTRAL
  Administered 2017-10-19: 1600 [IU]/h via INTRAVENOUS_CENTRAL
  Administered 2017-10-19: 1400 [IU]/h via INTRAVENOUS_CENTRAL
  Administered 2017-10-20: 1850 [IU]/h via INTRAVENOUS_CENTRAL
  Administered 2017-10-20: 1750 [IU]/h via INTRAVENOUS_CENTRAL
  Filled 2017-10-16 (×18): qty 2

## 2017-10-16 NOTE — Progress Notes (Signed)
Dr. Gala Romneybensimhon made aware of K of 3.8 and no new orders. Pt is on 4 k bath with crrt. Tammy SoursAngela Keron Neenan

## 2017-10-16 NOTE — Procedures (Signed)
Central Venous Hemodiaylsis Catheter Insertion Procedure Note Edgar [name] [id number] 05/10/1961  Procedure: Insertion of Central Venous Catheter Indications: Assessment of intravascular volume, Drug and/or fluid administration, Frequent blood sampling and Hemodialysis  Procedure Details Consent: Risks of procedure as well as the alternatives and risks of each were explained to the (patient/caregiver).  Consent for procedure obtained. Time Out: Verified patient identification, verified procedure, site/side was marked, verified correct patient position, special equipment/implants available, medications/allergies/relevent history reviewed, required imaging and test results available.  Performed  Maximum sterile technique was used including antiseptics, cap, gloves, gown, hand hygiene, mask and sheet. Skin prep and existing cordis cleaned: Chlorhexidine; local anesthetic administered.  Wire inserted, existing cordis removed over wire and a 15 cm trialysis catheter was placed in the  right internal jugular vein using the Seldinger technique.  Evaluation Blood flow good Complications: No apparent complications; expected brief, spontaneous resolved run of Vtach Patient did tolerate procedure well. Chest X-ray ordered to verify placement.  CXR: pending.  Edgar [name], AGACNP-BC [facility] Pgr: [phone] or if no answer [phone] 10/16/2017, 11:43 AM

## 2017-10-16 NOTE — Progress Notes (Signed)
Progress Note  Patient Name: Edgar [name] Date of Encounter: 10/16/2017  Primary Cardiologist: primarily the [facility] [name]), Dr. [name] last in 2016  Subjective   Remains SOB, feels weak, no CP  Inpatient Medications    Scheduled Meds: . aspirin EC  81 mg Oral Daily  . enoxaparin (LOVENOX) injection  30 mg Subcutaneous Q24H  . hydrALAZINE  75 mg Oral Q8H  . isosorbide mononitrate  30 mg Oral Daily  . sodium chloride flush  3 mL Intravenous Q12H  . sodium chloride flush  3 mL Intravenous Q12H  . tamsulosin  0.4 mg Oral QPC supper   Continuous Infusions: . sodium chloride    . sodium chloride    . amiodarone 30 mg/hr (10/16/17 0600)  . furosemide 160 mg (10/16/17 0926)  . milrinone 0.25 mcg/kg/min (10/16/17 0600)   PRN Meds: sodium chloride, sodium chloride, acetaminophen, acetaminophen, labetalol, ondansetron (ZOFRAN) IV, ondansetron (ZOFRAN) IV, sodium chloride flush, sodium chloride flush   Vital Signs    Vitals:   10/16/17 0645 10/16/17 0700 10/16/17 0800 10/16/17 0900  BP: 119/77 116/75 129/82 117/77  Pulse: (!) 112 (!) 110 (!) 117 (!) 112  Resp: (!) 23 (!) 23 19 (!) 23  Temp: (!) 97.3 F (36.3 C) (!) 97.3 F (36.3 C) (!) 97.3 F (36.3 C)   TempSrc:   Core (Comment)   SpO2: 100% 99% 97% 100%  Weight:      Height:        Intake/Output Summary (Last 24 hours) at 10/16/17 0930 Last data filed at 10/16/17 0700  Gross per 24 hour  Intake           687.55 ml  Output              850 ml  Net          -162.45 ml   Filed Weights   10/14/17 0500 10/15/17 0500 10/16/17 0445  Weight: 207 lb 0.2 oz (93.9 kg) 207 lb 10.8 oz (94.2 kg) (S) 216 lb 11.4 oz (98.3 kg)    Telemetry    ST, infrequent PVCs, NSVT - Personally Reviewed  ECG    No new EKGs - Personally Reviewed  Physical Exam   GEN: No acute distress, appears ill Neck: ++ JVD Cardiac: RRR, tachycardic, no murmurs, rubs, or gallops.  Respiratory: diminished at the bases bilaterally. GI:  Soft, nontender, non-distended  MS: marked b/l edema Neuro:  Nonfocal  Psych: Normal affect   Labs    Chemistry Recent Labs Lab 10/13/17 1045 10/14/17 0203 10/15/17 0350 10/16/17 0521  NA 133* 134* 132* 133*  K 3.7 3.3* 4.7 4.0  CL 103 103 101 101  CO2 17* 19* 16* 20*  GLUCOSE 124* 120* 119* 105*  BUN 53* 49* 54* 58*  CREATININE 3.40* 3.44* 3.91* 4.58*  CALCIUM 8.7* 8.4* 8.8* 8.5*  PROT 7.6  --   --  6.6  ALBUMIN 3.4*  --   --  3.0*  AST 45*  --   --  18  ALT 158*  --   --  72*  ALKPHOS 78  --   --  63  BILITOT 2.1*  --   --  1.2  GFRNONAA 18* 18* 16* 13*  GFRAA 21* 21* 18* 15*  ANIONGAP 13 12 15 12      Hematology Recent Labs Lab 10/14/17 1013 10/15/17 0350 10/16/17 0521  WBC 8.4 9.3 8.2  RBC 4.68 4.86 4.23  HGB 12.5* 13.4 11.4*  HCT 37.4* 39.2 33.9*  MCV 79.9 80.7 80.1  MCH 26.7 27.6 27.0  MCHC 33.4 34.2 33.6  RDW 16.3* 16.2* 15.9*  PLT 232 224 208    Cardiac Enzymes Recent Labs Lab 10/13/17 1045 10/13/17 1831 10/13/17 2219 10/14/17 0203  TROPONINI 0.11* 0.09* 0.08* 0.08*   No results for input(s): TROPIPOC in the last 168 hours.   BNP Recent Labs Lab 10/13/17 1045  BNP 3,149.4*     DDimer No results for input(s): DDIMER in the last 168 hours.   Radiology    No results found.  Cardiac Studies   10/15/17: RHC Findings:  RA = 20 RV = 53/23 PA = 54/26 (39) PCW = 28 v =35 Fick cardiac output/index = 3.6/1.7 Thermo CO/CI = 7.8/3.6 PVR =1.8 WU SVR = 2022 Ao sat = 99% PA sat = 56%, 56% SVC sat 66%  Assessment:  Low output HF with cardiorenal syndrome  Plan/Discussion:  Add milrinone. If not improving may need CVVHD   10/14/17: TTE Study Conclusions - Left ventricle: The cavity size was severely dilated. There was   mild concentric hypertrophy. Systolic function was normal. The   estimated ejection fraction was 20%. Severe diffuse hypokinesis   with regional variations. There was a reduced contribution of   atrial  contraction to ventricular filling, due to increased   ventricular diastolic pressure or atrial contractile dysfunction.   Doppler parameters are consistent with a reversible restrictive   pattern, indicative of decreased left ventricular diastolic   compliance and/or increased left atrial pressure (grade 3   diastolic dysfunction). Doppler parameters are consistent with   high ventricular filling pressure. - Aortic valve: There was trivial regurgitation. - Mitral valve: Calcified annulus. There was moderate   regurgitation. - Left atrium: The atrium was moderately to severely dilated. - Tricuspid valve: There was mild-moderate regurgitation. - Pulmonary arteries: PA peak pressure: 35 mm Hg (S) Impressions: - The right ventricular systolic pressure was increased consistent   with mild pulmonary hypertension.   Patient Profile     56 y.o. male hx of severe HTN, CRI (IV),CVA, chronic CHF (systolic) w/NICM who was admitted with ICD shocks 2/2 VT/VF, in sever CHF exacerbation  Assessment & Plan    1. VT/VF     Likely provoked by severe volume OL     he has not had any sustained VT, only a couple NSVT episodes     He remains very OL today and worry about PO absorption of oral amio, leave on IV gtt today again  No driving  2. CHF exacerbation     AHF team on board     not candidate for ACE/ABR/ARNI/spiro with CKD     no b-blocker with acute decompensation     s/p RHC, low output failure on milrinone     C.w AHF team  3. CKD     Worse, though this AM had better urine OP reported   4. HTN     BP looks good  For questions or updates, please contact [facility] Please consult [email or web address] for contact info under Cardiology/STEMI.      Signed, Sheilah Pigeonenee Lynn Ursuy, PA-C  10/16/2017, 9:30 AM    EP attending  Patient seen and examined. Agree with the findings as noted above. The patient's heart failure has been refractory to medical therapy. Fortunately his ventricular  tachycardia remains recently well controlled. I initially planned to transition the patient from IV to oral amiodarone today. However because of his refractory heart failure, I plan to continue IV amiodarone  for now and transition to oral amiodarone if and when his heart failure improves.  Lewayne Bunting, M.D.

## 2017-10-16 NOTE — Consult Note (Signed)
Reason for Consult: Volume overload has worsening renal function. Referring Physician: Dr. Royann Shivers  HPI. 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  He had a syncopal episode approximately 3 weeks ago, never seek medical attention, on device interrogation found to have multiple episodes of Vt/VF with shocks recorded approximately 3-4 times, rest of those episodes were self limiting.  He was found to have in severe heart failure, right heart cath done yesterday shows elevated pulmonary pressures with decreased ejection fraction and low output, milrinone was added. He became oliguric yesterday with worsening of renal function, unable to pull volume despite being on high-dose of Lasix, nephrology was consulted for his worsening renal function.  On initial presentation his creatinine was at baseline, on records it was between 3.3-4 in October 2017,  with baseline between 3.3-3.5. Renal ultrasound on 2016 shows smaller kidneys. Presenting intrinsic renal disease most likely due to uncontrolled long-term hypertension. He has family history of renal disease with his mother being on dialysis. He remained tachypneic and tachycardic, saturating well on 2 L. His renal function deteriorates today, most likely due to hypoperfusion of his kidneys due to low output heart failure, representing cardiorenal syndrome. His urine output improved slightly today. Clinically he remained severely volume overloaded.  Patient denies any chest pain or worsening of shortness of breath. Patient was using Aleve most of the days because of generalized aches and pains. Has not seen Nephrology in [place], followed at  [facility].  Takes significant amount of Alleve  Trend in Creatinine: Creatinine, Ser  Date/Time Value Ref Range Status  10/16/2017 05:21 AM 4.58 (H)  0.61 - 1.24 mg/dL Final  16/09/9603 54:09 AM 3.91 (H) 0.61 - 1.24 mg/dL Final  81/19/1478 29:56 AM 3.44 (H) 0.61 - 1.24 mg/dL Final  21/30/8657 84:69 AM 3.40 (H) 0.61 - 1.24 mg/dL Final  62/95/2841 32:44 AM 3.01 (H) 0.61 - 1.24 mg/dL Final  12/25/7251 66:44 AM 3.22 (H) 0.61 - 1.24 mg/dL Final  03/47/4259 56:38 AM 2.91 (H) 0.61 - 1.24 mg/dL Final  75/64/3329 51:88 AM 3.06 (H) 0.61 - 1.24 mg/dL Final  41/66/0630 16:01 AM 3.07 (H) 0.61 - 1.24 mg/dL Final  09/32/3557 32:20 PM 3.07 (H) 0.61 - 1.24 mg/dL Final    PMH:   Past Medical History:  Diagnosis Date  . CHF (congestive heart failure) (HCC)    Years ago, treated and no recurrence  . CKD (chronic kidney disease)   . Hypertension   . Stroke [facility])    2014, residual R sided deficits    PSH:   Past Surgical History:  Procedure Laterality Date  . ANKLE SURGERY Right 2008  . CARDIAC DEFIBRILLATOR PLACEMENT    . RIGHT HEART CATH N/A 10/15/2017   Procedure: RIGHT HEART CATH;  Surgeon: Dolores Patty, MD;  Location: [facility] INVASIVE CV LAB;  Service: Cardiovascular;  Laterality: N/A;    Allergies: No Known Allergies  Medications:   Prior to Admission medications   Medication Sig Start Date End Date Taking? Authorizing Provider  amLODipine (NORVASC) 5 MG tablet Take 1 tablet (5 mg total) by mouth daily. 12/07/15  Yes Mikhail, Nita Sells, DO  aspirin (ASPIRIN EC LO-DOSE) 81 MG EC tablet Take 81 mg by mouth daily. 03/23/13  Yes [provider]  carvedilol (COREG) 12.5 MG tablet Take 1 tablet (12.5 mg total) by mouth 2 (two) times daily with a meal.  12/07/15  Yes Mikhail, Nita SellsMaryann, DO  furosemide (LASIX) 20 MG tablet Take 3 tablets (60 mg total) by mouth daily. 12/07/15  Yes Mikhail, Nita SellsMaryann, DO  hydrALAZINE (APRESOLINE) 25 MG tablet Take 1 tablet (25 mg total) by mouth 3 (three) times daily. 12/07/15  Yes Mikhail, Nita SellsMaryann, DO  isosorbide mononitrate (IMDUR) 30 MG 24 hr tablet Take 1 tablet (30 mg total) by mouth daily. 12/07/15  Yes  Mikhail, Nita SellsMaryann, DO  potassium chloride (KLOR-CON M15) 15 MEQ tablet Take 2 tablets (30 mEq total) by mouth daily. 12/07/15  Yes Mikhail, Nita SellsMaryann, DO  tamsulosin (FLOMAX) 0.4 MG CAPS capsule Take 0.4 mg by mouth daily.   Yes [provider]  guaiFENesin (ROBITUSSIN) 100 MG/5ML SOLN Take 5 mLs (100 mg total) by mouth every 4 (four) hours as needed for cough or to loosen phlegm. 12/07/15   Edsel PetrinMikhail, Maryann, DO    Inpatient medications: . [START ON 10/17/2017] aspirin  81 mg Oral Pre-Cath  . aspirin EC  81 mg Oral Daily  . [START ON 10/17/2017] heparin subcutaneous  5,000 Units Subcutaneous Q8H  . hydrALAZINE  75 mg Oral Q8H  . isosorbide mononitrate  30 mg Oral Daily  . sodium chloride flush  3 mL Intravenous Q12H  . sodium chloride flush  3 mL Intravenous Q12H  . sodium chloride flush  3 mL Intravenous Q12H  . tamsulosin  0.4 mg Oral QPC supper    Discontinued Meds:   Medications Discontinued During This Encounter  Medication Reason  . amLODipine (NORVASC) tablet 5 mg   . nitroGLYCERIN 50 mg in dextrose 5 % 250 mL (0.2 mg/mL) infusion   . furosemide (LASIX) 250 mg in dextrose 5 % 250 mL (1 mg/mL) infusion   . heparin ADULT infusion 100 units/mL (25000 units/26550mL sodium chloride 0.45%)   . hydrALAZINE (APRESOLINE) tablet 50 mg   . amiodarone (PACERONE) tablet 400 mg   . amiodarone (PACERONE) tablet 400 mg   . potassium chloride (K-DUR,KLOR-CON) CR tablet 30 mEq   . furosemide (LASIX) 120 mg in dextrose 5 % 50 mL IVPB   . lidocaine (XYLOCAINE) 2 % (with pres) injection Patient Discharge  . 0.9 %  sodium chloride infusion Patient Discharge  . heparin injection 5,000 Units   . acetaminophen (TYLENOL) tablet 650 mg Duplicate  . ondansetron (ZOFRAN) injection 4 mg Duplicate  . enoxaparin (LOVENOX) injection 30 mg     Social History:  reports that he has never smoked. He has never used smokeless tobacco. He reports that he does not drink alcohol or use drugs.  Family  History:   Family History  Problem Relation Age of Onset  . Congestive Heart Failure Mother   . Hypertension Mother   . Kidney disease Mother   . Arrhythmia Father        has Pacemaker or ICD    Pertinent items are noted in HPI. Weight change: 9 lb 0.6 oz (4.1 kg)  Intake/Output Summary (Last 24 hours) at 10/16/17 1318 Last data filed at 10/16/17 1200  Gross per 24 hour  Intake           823.55 ml  Output             1075 ml  Net          -251.45 ml   BP 126/77   Pulse (!) 116   Temp 98.5 F (36.9 C) (Oral)   Resp (!) 26   Ht 5\' 11"  (1.803 m)   Wt (S) 216 lb  11.4 oz (98.3 kg)   SpO2 98%   BMI 30.23 kg/m  Vitals:   10/16/17 1100 10/16/17 1108 10/16/17 1200 10/16/17 1247  BP: 119/65  (!) 126/94 126/77  Pulse: (!) 114  (!) 116   Resp: (!) 23  (!) 25 (!) 26  Temp:  98.5 F (36.9 C)    TempSrc:  Oral    SpO2: 100%  100% 98%  Weight:      Height:         Vitals:   10/16/17 1100 10/16/17 1108 10/16/17 1200 10/16/17 1247  BP: 119/65  (!) 126/94 126/77  Pulse: (!) 114  (!) 116   Resp: (!) 23  (!) 25 (!) 26  Temp:  98.5 F (36.9 C)    TempSrc:  Oral    SpO2: 100%  100% 98%  Weight:      Height:       General: Vital signs reviewed.  Patient is well-developed , in no acute distress and cooperative with exam.  Head: Normocephalic and atraumatic.Fundie with hypertensive changes Eyes: EOMI, conjunctivae normal, no scleral icterus.  Neck: Supple, trachea midline, normal ROM,  JVD, no masses, thyromegaly, or carotid bruit present.PCL  Cardiovascular: RRR, S1 normal, S2 normal, no murmurs, gallops, or rubs.Gr2/6 holosys M Pulmonary/Chest: Decreased breath sounds in bases.rales to mid lungs Abdominal: Soft, non-tender, non-distended, BS +Liver down 6 cm . Extremities: 3+ lower extremity edema bilaterally up to thighs, pulses symmetric and intact bilaterally. Psychiatric: Normal mood and affect. speech and behavior is normal. Cognition and memory are normal.  Labs: Basic  Metabolic Panel:  Recent Labs Lab 10/13/17 1045 10/14/17 0203 10/15/17 0350 10/16/17 0521  NA 133* 134* 132* 133*  K 3.7 3.3* 4.7 4.0  CL 103 103 101 101  CO2 17* 19* 16* 20*  GLUCOSE 124* 120* 119* 105*  BUN 53* 49* 54* 58*  CREATININE 3.40* 3.44* 3.91* 4.58*  ALBUMIN 3.4*  --   --  3.0*  CALCIUM 8.7* 8.4* 8.8* 8.5*   Liver Function Tests:  Recent Labs Lab 10/13/17 1045 10/16/17 0521  AST 45* 18  ALT 158* 72*  ALKPHOS 78 63  BILITOT 2.1* 1.2  PROT 7.6 6.6  ALBUMIN 3.4* 3.0*   No results for input(s): LIPASE, AMYLASE in the last 168 hours. No results for input(s): AMMONIA in the last 168 hours. CBC:  Recent Labs Lab 10/13/17 1045 10/14/17 1013 10/15/17 0350 10/16/17 0521  WBC 8.9 8.4 9.3 8.2  NEUTROABS 6.8  --   --   --   HGB 13.4 12.5* 13.4 11.4*  HCT 39.4 37.4* 39.2 33.9*  MCV 79.0 79.9 80.7 80.1  PLT 243 232 224 208   PT/INR: @LABRCNTIP (inr:5) Cardiac Enzymes: ) Recent Labs Lab 10/13/17 1045 10/13/17 1831 10/13/17 2219 10/14/17 0203  TROPONINI 0.11* 0.09* 0.08* 0.08*   CBG:  Recent Labs Lab 10/13/17 1530 10/13/17 1946  GLUCAP 99 118*    Iron Studies: No results for input(s): IRON, TIBC, TRANSFERRIN, FERRITIN in the last 168 hours.  Xrays/Other Studies: Dg Chest Port 1 View  Result Date: 10/16/2017 CLINICAL DATA:  Central catheter placement EXAM: PORTABLE CHEST 1 VIEW COMPARISON:  October 13, 2017 FINDINGS: Central catheter tip is in the superior vena cava. No pneumothorax. There is no edema or consolidation. Heart is mildly enlarged with pulmonary vascularity within normal limits. Pacemaker lead is attached the right ventricle. No adenopathy. No bone lesions. IMPRESSION: Central catheter tip in superior vena cava. No pneumothorax. No edema or consolidation. Stable cardiomegaly. Electronically Signed  By: Bretta Bang III M.D.   On: 10/16/2017 12:21     Assessment/Plan: 1.  AKI with CKD 4. Patient has CK D4 , most likely  secondary to his Uncontrolled hypertension, regular NSAID use can be contributory. His recent worsening of renal function with acidemia is due to cardiorenal syndrome because of low output heart failure, low RBF. - We will start him on CRRT at the rate of 150/h to remove extra volume which should help with his cardiac function too. Plan is to remove at least 3600 in 24 hours.Will lower acid level and solute -Check UA, renal ultrasound, parathyroid and phosphorous. -Continue monitoring renal function.  2. HFrEF. Heart failure team is following. Currently on milrinone. Removal of external volume should help improve his cardiac function.  3. Arrhythmias. Electrophysiology is following. Volume overloaded with worsening in CHF can be a trigger. Patient currently on amiodarone.Has pacer/defib  4. Hypertension. Currently normotensive on hydralazine and Imdur. We anticipate decreased in blood pressure with removal of external volume. -Keep monitoring.  5. Anemia. Currently hemoglobin stable at his baseline around 11.4-12. Most likely anemia of chronic disease due to his CKD. We will check iron studies and will replete if needed. 6 HPTH check      Sumayya Amin 10/16/2017, 1:18 PM  I have seen and examined this patient and agree with the plan of care seen, eval,examined, explained to patient, questions answered.  Discussed with staff .  Bryssa Tones L 10/16/2017, 5:36 PM

## 2017-10-16 NOTE — Progress Notes (Signed)
Advanced Heart Failure Rounding Note   Subjective:    Yesterday underwent RHC after failure ot diureses with worsening renal function and dyspnea. Yesterday IV lasix increased to160 mg three times a day. Milrinone added after RHC.   Feeling better. Denies SOB.   Todays Swan numbers  CVP 13 PAP 50/27 CO 7.9 CI 3.7  PCWP 20 CO-OX 73%.    RHC 10/16/2017  RA = 20 RV = 53/23 PA = 54/26 (39) PCW = 28 v =35 Fick cardiac output/index = 3.6/1.7 Thermo CO/CI = 7.8/3.6 PVR =1.8 WU SVR = 2022 Ao sat = 99% PA sat = 56%, 56% SVC sat 66%  Objective:   Weight Range:  Vital Signs:   Temp:  [96.6 F (35.9 C)-98.1 F (36.7 C)] 97.3 F (36.3 C) (10/25 0800) Pulse Rate:  [89-117] 112 (10/25 0900) Resp:  [13-38] 23 (10/25 0900) BP: (100-136)/(68-111) 117/77 (10/25 0900) SpO2:  [95 %-100 %] 100 % (10/25 0900) Weight:  [216 lb 11.4 oz (98.3 kg)] 216 lb 11.4 oz (98.3 kg) (10/25 0445) Last BM Date: 10/13/17  Weight change: Filed Weights   10/14/17 0500 10/15/17 0500 10/16/17 0445  Weight: 207 lb 0.2 oz (93.9 kg) 207 lb 10.8 oz (94.2 kg) (S) 216 lb 11.4 oz (98.3 kg)    Intake/Output:   Intake/Output Summary (Last 24 hours) at 10/16/17 1001 Last data filed at 10/16/17 0926  Gross per 24 hour  Intake           664.85 ml  Output              725 ml  Net           -60.15 ml     Physical Exam: General:  Appears chronically ill. Tachypneice HEENT: normal Neck: supple. JVP to jaw. Carotids 2+ bilat; no bruits. No lymphadenopathy or thryomegaly appreciated. Cor: PMI nondisplaced. Regular rate & rhythm. No rubs, gallops or murmurs. Lungs: decreased. On 2 liters [place].  Abdomen: soft, nontender, nondistended. No hepatosplenomegaly. No bruits or masses. Good bowel sounds. Extremities: no cyanosis, clubbing, rash, RLE /LLE 3+ edema with ted hose.  Neuro: alert & orientedx3, cranial nerves grossly intact. moves all 4 extremities w/o difficulty. Affect flat   Telemetry: Sinus Tach  100s personally reviewed.   Labs: Basic Metabolic Panel:  Recent Labs Lab 10/13/17 1045 10/13/17 1831 10/14/17 0203 10/15/17 0350 10/16/17 0521  NA 133*  --  134* 132* 133*  K 3.7  --  3.3* 4.7 4.0  CL 103  --  103 101 101  CO2 17*  --  19* 16* 20*  GLUCOSE 124*  --  120* 119* 105*  BUN 53*  --  49* 54* 58*  CREATININE 3.40*  --  3.44* 3.91* 4.58*  CALCIUM 8.7*  --  8.4* 8.8* 8.5*  MG  --  2.2  --  2.0  --     Liver Function Tests:  Recent Labs Lab 10/13/17 1045 10/16/17 0521  AST 45* 18  ALT 158* 72*  ALKPHOS 78 63  BILITOT 2.1* 1.2  PROT 7.6 6.6  ALBUMIN 3.4* 3.0*   No results for input(s): LIPASE, AMYLASE in the last 168 hours. No results for input(s): AMMONIA in the last 168 hours.  CBC:  Recent Labs Lab 10/13/17 1045 10/14/17 1013 10/15/17 0350 10/16/17 0521  WBC 8.9 8.4 9.3 8.2  NEUTROABS 6.8  --   --   --   HGB 13.4 12.5* 13.4 11.4*  HCT 39.4 37.4* 39.2 33.9*  MCV 79.0 79.9 80.7 80.1  PLT 243 232 224 208    Cardiac Enzymes:  Recent Labs Lab 10/13/17 1045 10/13/17 1831 10/13/17 2219 10/14/17 0203  TROPONINI 0.11* 0.09* 0.08* 0.08*    BNP: BNP (last 3 results)  Recent Labs  10/13/17 1045  BNP 3,149.4*    ProBNP (last 3 results) No results for input(s): PROBNP in the last 8760 hours.    Other results:  Imaging: No results found.   Medications:     Scheduled Medications: . aspirin EC  81 mg Oral Daily  . enoxaparin (LOVENOX) injection  30 mg Subcutaneous Q24H  . hydrALAZINE  75 mg Oral Q8H  . isosorbide mononitrate  30 mg Oral Daily  . sodium chloride flush  3 mL Intravenous Q12H  . sodium chloride flush  3 mL Intravenous Q12H  . tamsulosin  0.4 mg Oral QPC supper    Infusions: . sodium chloride    . sodium chloride    . amiodarone 30 mg/hr (10/16/17 0600)  . furosemide 160 mg (10/16/17 0926)  . milrinone 0.25 mcg/kg/min (10/16/17 0600)    PRN Medications: sodium chloride, sodium chloride, acetaminophen,  acetaminophen, labetalol, ondansetron (ZOFRAN) IV, ondansetron (ZOFRAN) IV, sodium chloride flush, sodium chloride flush   Patient Profile   56 y/o male with h/o severe HTN, systolic HF with EF 25-30%, DM, CKD 4 admitted with decompensated HF and recent ICD shock.   Assessment/Plan   1. Acute on chronic systolic HF due to NICM  10/15/17 ECHO EF 20% Grade III DD and Mod MR - cardiac cath 10/17. No CAD - Medtronic ICD - Admitted with marked volume overload and Class IV HF symptoms in setting of recent VT/VF with ICD shock on 09/21/16.  -Post cath started on milrinone. Looks more comfortable. Improved urine output but renal function worse. CO-OX improved from 54%>73%. CVP coming down. Increased urine output.  -Continue 160 mg IV lasix three times a day.  - not candidate for ACE/ABR/ARNI/spiro with CKD - no b-blocker with acute decompensation. - given CKD IV only durable long-term option will be consideration of heart-kidney transplantation which we discussed  2. VT/VF - multiple recent events with appropriate ICD therapies.  - Continue amio drip 30 mg per hour.  - Keep K > 4.0 Mg 2.0.  - Check Mag.   - No driving for 6 months.   3. Elevated troponin - Not ACS. Due to HF. Cath 10/17 no cad - Np CP   4. CKD 4 - Creatinine baseline 3.0-3.5.  - Creatinine trending up 3.4>3.9> 4.58  Consult nephrology. May need HD.  - Says he has [facility] [place].   5. HTN Continue hydralazine 75 mg tid + imdur 30 daily.   6. Severe mitral regurgitation - likely functional MR. -Moderate MR   7. Previous CVA - mild R-sided deficits. PT/OT to see as he recovers   Length of Stay: 3  Amy Clegg NP-C  10/16/2017, 10:01 AM  Advanced Heart Failure Team Pager [phone] (M-F; 7a - 4p)  Please contact [facility] for night-coverage after hours (4p -7a ) and weekends on [email or web address]  Agree with above  He continues to deteriorate. RHC numbers yesterday c/w low  output and volume overload. Milrinone added. Hemodynamics improved today (swan numbers reviewed personally) but renal function worse with poor output. Have d/w renal and will consider CVVHD.  Still mildly volume overloaded but numbers improved (PCWP only 20) with milrinone so will hold lasix today pending renal eval.  He will be poor candidate for long-term HD so may need to consider transfer to [facility] for consideration of heart kidney transplant. Will check blod type. Transfer to [facility] ICU  On exam. Tachypneic RIJ swan  Cor Regular. Laterally displaced Lung dull at bases Ab obese NT Ext warm. Mild edema.   Plan as above.   CRITICAL CARE Performed by: Arvilla Meres  Total critical care time: 35 minutes  Critical care time was exclusive of separately billable procedures and treating other patients.  Critical care was necessary to treat or prevent imminent or life-threatening deterioration.  Critical care was time spent personally by me (independent of midlevel providers or residents) on the following activities: development of treatment plan with patient and/or surrogate as well as nursing, discussions with consultants, evaluation of patient's response to treatment, examination of patient, obtaining history from patient or surrogate, ordering and performing treatments and interventions, ordering and review of laboratory studies, ordering and review of radiographic studies, pulse oximetry and re-evaluation of patient's condition.  Arvilla Meres, MD  10:56 AM

## 2017-10-17 LAB — CBC
HCT: 32.2 % — ABNORMAL LOW (ref 39.0–52.0)
HEMOGLOBIN: 11 g/dL — AB (ref 13.0–17.0)
MCH: 27.1 pg (ref 26.0–34.0)
MCHC: 34.2 g/dL (ref 30.0–36.0)
MCV: 79.3 fL (ref 78.0–100.0)
PLATELETS: 180 10*3/uL (ref 150–400)
RBC: 4.06 MIL/uL — ABNORMAL LOW (ref 4.22–5.81)
RDW: 15.7 % — ABNORMAL HIGH (ref 11.5–15.5)
WBC: 8.6 10*3/uL (ref 4.0–10.5)

## 2017-10-17 LAB — RENAL FUNCTION PANEL
ALBUMIN: 2.9 g/dL — AB (ref 3.5–5.0)
ALBUMIN: 2.8 g/dL — AB (ref 3.5–5.0)
ANION GAP: 7 (ref 5–15)
Anion gap: 10 (ref 5–15)
BUN: 38 mg/dL — ABNORMAL HIGH (ref 6–20)
BUN: 24 mg/dL — ABNORMAL HIGH (ref 6–20)
CALCIUM: 8.1 mg/dL — AB (ref 8.9–10.3)
CHLORIDE: 99 mmol/L — AB (ref 101–111)
CO2: 23 mmol/L (ref 22–32)
CO2: 25 mmol/L (ref 22–32)
Calcium: 8.1 mg/dL — ABNORMAL LOW (ref 8.9–10.3)
Chloride: 100 mmol/L — ABNORMAL LOW (ref 101–111)
Creatinine, Ser: 3.24 mg/dL — ABNORMAL HIGH (ref 0.61–1.24)
Creatinine, Ser: 2.4 mg/dL — ABNORMAL HIGH (ref 0.61–1.24)
GFR calc non Af Amer: 28 mL/min — ABNORMAL LOW (ref >60)
GFR, EST AFRICAN AMERICAN: 23 mL/min — AB (ref >60)
GFR, EST AFRICAN AMERICAN: 33 mL/min — AB (ref >60)
GFR, EST NON AFRICAN AMERICAN: 20 mL/min — AB (ref >60)
GLUCOSE: 120 mg/dL — AB (ref 65–99)
Glucose, Bld: 101 mg/dL — ABNORMAL HIGH (ref 65–99)
PHOSPHORUS: 2.9 mg/dL (ref 2.5–4.6)
PHOSPHORUS: 2.1 mg/dL — AB (ref 2.5–4.6)
POTASSIUM: 3.8 mmol/L (ref 3.5–5.1)
Potassium: 4.1 mmol/L (ref 3.5–5.1)
SODIUM: 132 mmol/L — AB (ref 135–145)
Sodium: 132 mmol/L — ABNORMAL LOW (ref 135–145)

## 2017-10-17 LAB — POCT ACTIVATED CLOTTING TIME
ACTIVATED CLOTTING TIME: 213 s
ACTIVATED CLOTTING TIME: 158 s
ACTIVATED CLOTTING TIME: 158 s
ACTIVATED CLOTTING TIME: 169 s
ACTIVATED CLOTTING TIME: 164 s
ACTIVATED CLOTTING TIME: 169 s
ACTIVATED CLOTTING TIME: 180 s
ACTIVATED CLOTTING TIME: 175 s
ACTIVATED CLOTTING TIME: 213 s
Activated Clotting Time: 158 seconds
Activated Clotting Time: 164 seconds
Activated Clotting Time: 186 seconds
Activated Clotting Time: 191 seconds
Activated Clotting Time: 202 seconds
Activated Clotting Time: 202 seconds
Activated Clotting Time: 213 seconds

## 2017-10-17 LAB — MAGNESIUM: Magnesium: 2.2 mg/dL (ref 1.7–2.4)

## 2017-10-17 LAB — PARATHYROID HORMONE, INTACT (NO CA): PTH: 269 pg/mL — AB (ref 15–65)

## 2017-10-17 LAB — COOXEMETRY PANEL
Carboxyhemoglobin: 1.5 % (ref 0.5–1.5)
Methemoglobin: 1.1 % (ref 0.0–1.5)
O2 SAT: 80.6 %
TOTAL HEMOGLOBIN: 10.9 g/dL — AB (ref 12.0–16.0)

## 2017-10-17 LAB — APTT: aPTT: 52 seconds — ABNORMAL HIGH (ref 24–36)

## 2017-10-17 MED ORDER — POTASSIUM CHLORIDE CRYS ER 20 MEQ PO TBCR
20.0000 meq | EXTENDED_RELEASE_TABLET | Freq: Once | ORAL | Status: AC
  Administered 2017-10-17: 20 meq via ORAL
  Filled 2017-10-17: qty 1

## 2017-10-17 MED ORDER — CALCITRIOL 0.5 MCG PO CAPS
0.5000 ug | ORAL_CAPSULE | Freq: Every day | ORAL | Status: DC
  Administered 2017-10-17 – 2017-10-28 (×12): 0.5 ug via ORAL
  Filled 2017-10-17 (×3): qty 2
  Filled 2017-10-17 (×2): qty 1
  Filled 2017-10-17: qty 2
  Filled 2017-10-17: qty 1
  Filled 2017-10-17 (×3): qty 2
  Filled 2017-10-17 (×2): qty 1

## 2017-10-17 MED ORDER — SODIUM CHLORIDE 0.9 % IV SOLN
510.0000 mg | Freq: Once | INTRAVENOUS | Status: AC
  Administered 2017-10-17: 510 mg via INTRAVENOUS
  Filled 2017-10-17: qty 17

## 2017-10-17 NOTE — Progress Notes (Signed)
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  Patient was feeling better and since this morning, appears tired.tolerating CRRT very well.  O:BP 104/60   Pulse (!) 114   Temp 98.6 F (37 C) (Oral)   Resp (!) 23   Ht 5\' 11"  (1.803 m)   Wt 216 lb 11.4 oz (98.3 kg)   SpO2 98%   BMI 30.23 kg/m   Intake/Output Summary (Last 24 hours) at 10/17/17 1009 Last data filed at 10/17/17 1000  Gross per 24 hour  Intake           1736.2 ml  Output             3948 ml  Net          -2211.8 ml   Intake/Output: I/O last 3 completed shifts: In: 2123.8 [P.O.:875; I.V.:1016.8; IV Piggyback:232] Out: 4249 [Urine:1855; Other:2394]  Intake/Output this shift:  Total I/O In: 271.4 [P.O.:200; I.V.:71.4] Out: 699 [Urine:40; Other:659] Weight change: 0 lb (0 kg) Gen: well-developed, ill-appearing gentleman, in no acute distress. CVS: regular rate and rhythm. Gr2/6 m Resp: few basal crackles on anterior auscultation. Abd: Soft, non tender, bowel sounds positive. Liver down 5 cm Ext:3+ lower extremity edema up to thighs.   Recent Labs Lab 10/13/17 1045 10/14/17 0203 10/15/17 0350 10/16/17 0521 10/16/17 1600 10/17/17 0303  NA 133* 134* 132* 133* 132* 132*  K 3.7 3.3* 4.7 4.0 3.8 3.8  CL 103 103 101 101 100* 99*  CO2 17* 19* 16* 20* 22 23  GLUCOSE 124* 120* 119* 105* 122* 101*  BUN 53* 49* 54* 58* 54* 38*  CREATININE 3.40* 3.44* 3.91* 4.58* 4.14* 3.24*  ALBUMIN 3.4*  --   --  3.0* 2.9* 2.9*  CALCIUM 8.7* 8.4* 8.8* 8.5* 8.3* 8.1*  PHOS  --   --   --   --  4.1 2.9  AST 45*  --   --  18  --   --   ALT 158*  --   --  72*  --   --    Liver Function Tests:  Recent Labs Lab 10/13/17 1045 10/16/17 0521 10/16/17 1600 10/17/17 0303  AST 45* 18  --   --   ALT 158* 72*  --   --   ALKPHOS 78 63  --   --   BILITOT 2.1* 1.2  --   --    PROT 7.6 6.6  --   --   ALBUMIN 3.4* 3.0* 2.9* 2.9*   No results for input(s): LIPASE, AMYLASE in the last 168 hours. No results for input(s): AMMONIA in the last 168 hours. CBC:  Recent Labs Lab 10/13/17 1045 10/14/17 1013 10/15/17 0350 10/16/17 0521 10/17/17 0303  WBC 8.9 8.4 9.3 8.2 8.6  NEUTROABS 6.8  --   --   --   --   HGB 13.4 12.5* 13.4 11.4* 11.0*  HCT 39.4 37.4* 39.2 33.9* 32.2*  MCV 79.0 79.9 80.7 80.1 79.3  PLT 243 232 224 208 180   Cardiac Enzymes:  Recent Labs Lab 10/13/17 1045 10/13/17 1831 10/13/17 2219 10/14/17 0203  TROPONINI 0.11* 0.09* 0.08* 0.08*   CBG:  Recent Labs Lab 10/13/17 1530 10/13/17 1946  GLUCAP 99 118*    Iron Studies:  Recent Labs  10/16/17 1600  IRON 13*  TIBC 286  FERRITIN 63   Studies/Results: [place] Renal  Result Date: 10/16/2017 CLINICAL DATA:  Worsening renal function EXAM: RENAL / URINARY TRACT ULTRASOUND COMPLETE COMPARISON:  12/03/2015 FINDINGS: Right Kidney: Length: 9 cm. Increased cortical echogenicity. No hydronephrosis or mass. Left Kidney: Length: 9 cm. Increased cortical echogenicity. No hydronephrosis. Simple appearing 1 cm cyst. Bladder: Not visible.  A Foley catheter is present. Right pleural effusion that is small based on prior chest radiograph. IMPRESSION: 1. Medical renal disease and symmetric renal atrophy. No hydronephrosis. 2. Small right pleural effusion. Electronically Signed   By: Marnee Spring M.D.   On: 10/16/2017 16:09   Dg Chest Port 1 View  Result Date: 10/16/2017 CLINICAL DATA:  Central catheter placement EXAM: PORTABLE CHEST 1 VIEW COMPARISON:  October 13, 2017 FINDINGS: Central catheter tip is in the superior vena cava. No pneumothorax. There is no edema or consolidation. Heart is mildly enlarged with pulmonary vascularity within normal limits. Pacemaker lead is attached the right ventricle. No adenopathy. No bone lesions. IMPRESSION: Central catheter tip in superior vena cava. No  pneumothorax. No edema or consolidation. Stable cardiomegaly. Electronically Signed   By: Bretta Bang III M.D.   On: 10/16/2017 12:21   . aspirin  81 mg Oral Pre-Cath  . aspirin EC  81 mg Oral Daily  . Chlorhexidine Gluconate Cloth  6 each Topical Daily  . heparin subcutaneous  5,000 Units Subcutaneous Q8H  . hydrALAZINE  75 mg Oral Q8H  . isosorbide mononitrate  30 mg Oral Daily  . mouth rinse  15 mL Mouth Rinse BID  . sodium chloride flush  10-40 mL Intracatheter Q12H  . sodium chloride flush  3 mL Intravenous Q12H  . sodium chloride flush  3 mL Intravenous Q12H  . sodium chloride flush  3 mL Intravenous Q12H  . tamsulosin  0.4 mg Oral QPC supper    BMET    Component Value Date/Time   NA 132 (L) 10/17/2017 0303   K 3.8 10/17/2017 0303   CL 99 (L) 10/17/2017 0303   CO2 23 10/17/2017 0303   GLUCOSE 101 (H) 10/17/2017 0303   BUN 38 (H) 10/17/2017 0303   CREATININE 3.24 (H) 10/17/2017 0303   CALCIUM 8.1 (L) 10/17/2017 0303   GFRNONAA 20 (L) 10/17/2017 0303   GFRAA 23 (L) 10/17/2017 0303   CBC    Component Value Date/Time   WBC 8.6 10/17/2017 0303   RBC 4.06 (L) 10/17/2017 0303   HGB 11.0 (L) 10/17/2017 0303   HCT 32.2 (L) 10/17/2017 0303   PLT 180 10/17/2017 0303   MCV 79.3 10/17/2017 0303   MCH 27.1 10/17/2017 0303   MCHC 34.2 10/17/2017 0303   RDW 15.7 (H) 10/17/2017 0303   LYMPHSABS 1.4 10/13/2017 1045   MONOABS 0.6 10/13/2017 1045   EOSABS 0.1 10/13/2017 1045   BASOSABS 0.0 10/13/2017 1045     Assessment/Plan:  1. AKI with CKD 4. Creatinine improving on CRRT,able to remove more than 2 L of fluid since yesterday, hardly making any urine. -Continue CRRT at the current rate of 150-200 mL per hour. Goal is to remove approximately 3600 mL of fluid. -continue monitoring renal function.Good solute/acid/base/k control.. Vol better, hopefully will improve heart ms function  2. HFrEF. Heart failure team is following. Currently on milrinone. Removal of external  volume should help improve his cardiac function.  3. Arrhythmias. Electrophysiology is following. Volume overloaded with worsening in CHF can be a trigger. Patient currently on amiodarone.Has pacer/defib. No arrhythmia noted since the start of CRRT.  4. Hypertension.  Currently blood pressure little  softer, lower hydralazine. -Keep monitoring.  5. Anemia. Currently hemoglobin stable. Most likely anemia of chronic disease due to his CKD. Iron studies were consistent with iron deficiency. -Feraheme today. -Keep monitoring.  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 -Calcitriol 0.5 per day.  I have seen and examined this patient and agree with the plan of care seen, eval, examined, counseled patient discussed with staff and resident. .  Zymeir Salminen L 10/17/2017, 1:33 PM

## 2017-10-17 NOTE — Progress Notes (Signed)
Advanced Heart Failure Rounding Note   Subjective:    10/15/17 underwent RHC after failure ot diureses with worsening renal function and dyspnea. Had  IV lasix increased to160 mg three times a day. Milrinone added after RHC.   Trialysis cath placed 10/16/17. Now on CRRT. Pulling 150-200 cc an hour  Feeling better today. Very tired. Breathing has improved. Denies lightheadedness or dizziness.   Coox 80.6% on milrinone 0.25 mcg/kg/min.   RHC 10/16/2017  RA = 20 RV = 53/23 PA = 54/26 (39) PCW = 28 v =35 Fick cardiac output/index = 3.6/1.7 Thermo CO/CI = 7.8/3.6 PVR =1.8 WU SVR = 2022 Ao sat = 99% PA sat = 56%, 56% SVC sat 66%  Objective:   Weight Range:  Vital Signs:   Temp:  [97.3 F (36.3 C)-98.5 F (36.9 C)] 98.2 F (36.8 C) (10/25 2300) Pulse Rate:  [109-122] 112 (10/26 0700) Resp:  [19-31] 27 (10/26 0700) BP: (109-133)/(58-94) 109/58 (10/26 0700) SpO2:  [97 %-100 %] 97 % (10/26 0700) Weight:  [216 lb 11.4 oz (98.3 kg)] 216 lb 11.4 oz (98.3 kg) (10/26 0500) Last BM Date: 10/13/17  Weight change: Filed Weights   10/15/17 0500 10/16/17 0445 10/17/17 0500  Weight: 207 lb 10.8 oz (94.2 kg) (S) 216 lb 11.4 oz (98.3 kg) 216 lb 11.4 oz (98.3 kg)    Intake/Output:   Intake/Output Summary (Last 24 hours) at 10/17/17 0735 Last data filed at 10/17/17 0700  Gross per 24 hour  Intake           1678.9 ml  Output             3524 ml  Net          -1845.1 ml     Physical Exam:  General: Chronically ill.  HEENT: Normal Neck: Supple. JVP to jaw. Carotids 2+ bilat; no bruits. No thyromegaly or nodule noted. Cor: PMI nondisplaced. RRR, No M/G/R noted Lungs: Diminished on 2 L [place].  Abdomen: Soft, non-tender, non-distended, no HSM. No bruits or masses. +BS  Extremities: No cyanosis, clubbing, or rash. BLE with 2-3+ edema with ted hose.  Neuro: Alert & orientedx3, cranial nerves grossly intact. moves all 4 extremities w/o difficulty. Affect flat.   Telemetry:  Sinus tach 100s, Personally reviewed.   Labs: Basic Metabolic Panel:  Recent Labs Lab 10/13/17 1831 10/14/17 0203 10/15/17 0350 10/16/17 0521 10/16/17 1600 10/17/17 0303  NA  --  134* 132* 133* 132* 132*  K  --  3.3* 4.7 4.0 3.8 3.8  CL  --  103 101 101 100* 99*  CO2  --  19* 16* 20* 22 23  GLUCOSE  --  120* 119* 105* 122* 101*  BUN  --  49* 54* 58* 54* 38*  CREATININE  --  3.44* 3.91* 4.58* 4.14* 3.24*  CALCIUM  --  8.4* 8.8* 8.5* 8.3* 8.1*  MG 2.2  --  2.0 1.9  --  2.2  PHOS  --   --   --   --  4.1 2.9    Liver Function Tests:  Recent Labs Lab 10/13/17 1045 10/16/17 0521 10/16/17 1600 10/17/17 0303  AST 45* 18  --   --   ALT 158* 72*  --   --   ALKPHOS 78 63  --   --   BILITOT 2.1* 1.2  --   --   PROT 7.6 6.6  --   --   ALBUMIN 3.4* 3.0* 2.9* 2.9*   No results for  input(s): LIPASE, AMYLASE in the last 168 hours. No results for input(s): AMMONIA in the last 168 hours.  CBC:  Recent Labs Lab 10/13/17 1045 10/14/17 1013 10/15/17 0350 10/16/17 0521 10/17/17 0303  WBC 8.9 8.4 9.3 8.2 8.6  NEUTROABS 6.8  --   --   --   --   HGB 13.4 12.5* 13.4 11.4* 11.0*  HCT 39.4 37.4* 39.2 33.9* 32.2*  MCV 79.0 79.9 80.7 80.1 79.3  PLT 243 232 224 208 180    Cardiac Enzymes:  Recent Labs Lab 10/13/17 1045 10/13/17 1831 10/13/17 2219 10/14/17 0203  TROPONINI 0.11* 0.09* 0.08* 0.08*    BNP: BNP (last 3 results)  Recent Labs  10/13/17 1045  BNP 3,149.4*    ProBNP (last 3 results) No results for input(s): PROBNP in the last 8760 hours.    Other results:  Imaging: [place] Renal  Result Date: 10/16/2017 CLINICAL DATA:  Worsening renal function EXAM: RENAL / URINARY TRACT ULTRASOUND COMPLETE COMPARISON:  12/03/2015 FINDINGS: Right Kidney: Length: 9 cm. Increased cortical echogenicity. No hydronephrosis or mass. Left Kidney: Length: 9 cm. Increased cortical echogenicity. No hydronephrosis. Simple appearing 1 cm cyst. Bladder: Not visible.  A Foley catheter  is present. Right pleural effusion that is small based on prior chest radiograph. IMPRESSION: 1. Medical renal disease and symmetric renal atrophy. No hydronephrosis. 2. Small right pleural effusion. Electronically Signed   By: Marnee SpringJonathon  Watts M.D.   On: 10/16/2017 16:09   Dg Chest Port 1 View  Result Date: 10/16/2017 CLINICAL DATA:  Central catheter placement EXAM: PORTABLE CHEST 1 VIEW COMPARISON:  October 13, 2017 FINDINGS: Central catheter tip is in the superior vena cava. No pneumothorax. There is no edema or consolidation. Heart is mildly enlarged with pulmonary vascularity within normal limits. Pacemaker lead is attached the right ventricle. No adenopathy. No bone lesions. IMPRESSION: Central catheter tip in superior vena cava. No pneumothorax. No edema or consolidation. Stable cardiomegaly. Electronically Signed   By: Bretta BangWilliam  Woodruff III M.D.   On: 10/16/2017 12:21     Medications:     Scheduled Medications: . aspirin  81 mg Oral Pre-Cath  . aspirin EC  81 mg Oral Daily  . Chlorhexidine Gluconate Cloth  6 each Topical Daily  . heparin subcutaneous  5,000 Units Subcutaneous Q8H  . hydrALAZINE  75 mg Oral Q8H  . isosorbide mononitrate  30 mg Oral Daily  . mouth rinse  15 mL Mouth Rinse BID  . sodium chloride flush  10-40 mL Intracatheter Q12H  . sodium chloride flush  3 mL Intravenous Q12H  . sodium chloride flush  3 mL Intravenous Q12H  . sodium chloride flush  3 mL Intravenous Q12H  . tamsulosin  0.4 mg Oral QPC supper    Infusions: . sodium chloride    . sodium chloride    . sodium chloride    . sodium chloride    . amiodarone 30 mg/hr (10/17/17 0700)  . heparin 10,000 units/ 20 mL infusion syringe 1,200 Units/hr (10/17/17 0653)  . milrinone 0.25 mcg/kg/min (10/17/17 0700)  . dialysis replacement fluid (prismasate) 600 mL/hr at 10/16/17 2257  . dialysis replacement fluid (prismasate) 500 mL/hr at 10/17/17 0045  . dialysate (PRISMASATE) 1,500 mL/hr at 10/17/17 0353  .  sodium chloride      PRN Medications: sodium chloride, sodium chloride, sodium chloride, acetaminophen, heparin, heparin, labetalol, ondansetron (ZOFRAN) IV, sodium chloride, sodium chloride flush, sodium chloride flush, sodium chloride flush, sodium chloride flush   Patient Profile   56  y/o male with h/o severe HTN, systolic HF with EF 25-30%, DM, CKD 4 admitted with decompensated HF and recent ICD shock.   Assessment/Plan   1. Acute on chronic systolic HF due to NICM  10/15/17 ECHO EF 20% Grade III DD and Mod MR - cardiac cath 10/17. No CAD - Medtronic ICD - Admitted with marked volume overload and Class IV HF symptoms in setting of recent VT/VF with ICD shock on 09/21/16.  -Post cath started on milrinone. Looks more comfortable. Improved urine output but renal function worse. CO-OX improved from 54%>73%. CVP coming down. Increased urine output.  - Lasix on hold now with CVVHD.  - not candidate for ACE/ABR/ARNI/spiro with CKD - no b-blocker with acute decompensation. - given CKD IV only durable long-term option will be consideration of heart-kidney transplantation which we have discussed.   2. VT/VF - multiple recent events with appropriate ICD therapies.  - Continue amio drip 30 mg per hour.  - Keep K > 4.0 Mg 2.0.   - No driving for 6 months by [place] DMV state law.   3. Elevated troponin - Not ACS. Due to HF. Cath 10/17 no cad - No s/s of ischemia.      4. CKD 4 - Creatinine baseline 3.0-3.5.  - Now on CVVHD.  - Says he has seen Nephrology at the [facility] in [place].   5. HTN Continue hydralazine 75 mg tid + imdur 30 daily as tolerated.   6. Severe mitral regurgitation - likely functional MR. - Moderate MR on repeat Echo.   7. Previous CVA - mild R-sided deficits. PT/OT to see as he recovers.   Prognosis guarded.  Good response thus far to CRRT. Will hook CVP back up if able. Continue to follow. Would likely benefit from GOC discussion.   Length of Stay:  4  Luane School  10/17/2017, 7:35 AM  Advanced Heart Failure Team Pager [phone] (M-F; 7a - 4p)  Please contact [facility] for night-coverage after hours (4p -7a ) and weekends on [email or web address]  Patient seen and examined with the above-signed Advanced Practice Provider and/or Housestaff. I personally reviewed laboratory data, imaging studies and relevant notes. I independently examined the patient and formulated the important aspects of the plan. I have edited the note to reflect any of my changes or salient points. I have personally discussed the plan with the patient and/or family.  He remains critically ill. CVVHD started yesterday. Pulling fluid well. Breathing better but lethargic. No further VT/VF on amio.  On exam Lethargic RIJ trialysis cath Cor tachy regular + s3 Lungs clear anteriorly Ab soft NT Extremities mild edema  Will continue CVVHD for now. Once he is euvolemic will stop CVVHD and see if renal function returns. If not, will need to consider transfer to [facility] for evaluation of possible heart/kidney transplant if he is a candidate. Doubt he will tolerate outpatient HD well with very low EF.   CRITICAL CARE Performed by: Arvilla Meres  Total critical care time: 35 minutes  Critical care time was exclusive of separately billable procedures and treating other patients.  Critical care was necessary to treat or prevent imminent or life-threatening deterioration.  Critical care was time spent personally by me (independent of midlevel providers or residents) on the following activities: development of treatment plan with patient and/or surrogate as well as nursing, discussions with consultants, evaluation of patient's response to treatment, examination of patient, obtaining history from patient or surrogate, ordering and performing treatments and interventions, ordering  and review of laboratory studies, ordering and review of radiographic studies, pulse  oximetry and re-evaluation of patient's condition.  Arvilla Meres, MD  7:01 PM

## 2017-10-17 NOTE — Progress Notes (Signed)
Progress Note  Patient Name: Edgar PianChristopher Rohlfs Date of Encounter: 10/17/2017  Primary Cardiologist: Dr. Dorthea CoveB  Subjective   Feels better. CVVH initiated yesterday  Inpatient Medications    Scheduled Meds: . aspirin  81 mg Oral Pre-Cath  . aspirin EC  81 mg Oral Daily  . Chlorhexidine Gluconate Cloth  6 each Topical Daily  . heparin subcutaneous  5,000 Units Subcutaneous Q8H  . hydrALAZINE  75 mg Oral Q8H  . isosorbide mononitrate  30 mg Oral Daily  . mouth rinse  15 mL Mouth Rinse BID  . potassium chloride  20 mEq Oral Once  . sodium chloride flush  10-40 mL Intracatheter Q12H  . sodium chloride flush  3 mL Intravenous Q12H  . sodium chloride flush  3 mL Intravenous Q12H  . sodium chloride flush  3 mL Intravenous Q12H  . tamsulosin  0.4 mg Oral QPC supper   Continuous Infusions: . sodium chloride    . sodium chloride    . sodium chloride    . sodium chloride    . amiodarone 30 mg/hr (10/17/17 0800)  . heparin 10,000 units/ 20 mL infusion syringe 1,300 Units/hr (10/17/17 0809)  . milrinone 0.25 mcg/kg/min (10/17/17 0800)  . dialysis replacement fluid (prismasate) 600 mL/hr at 10/17/17 0812  . dialysis replacement fluid (prismasate) 500 mL/hr at 10/17/17 0045  . dialysate (PRISMASATE) 1,500 mL/hr at 10/17/17 0806  . sodium chloride     PRN Meds: sodium chloride, sodium chloride, sodium chloride, acetaminophen, heparin, heparin, labetalol, ondansetron (ZOFRAN) IV, sodium chloride, sodium chloride flush, sodium chloride flush, sodium chloride flush, sodium chloride flush   Vital Signs    Vitals:   10/17/17 0600 10/17/17 0700 10/17/17 0730 10/17/17 0800  BP: 127/73 (!) 109/58 114/64 106/62  Pulse: (!) 111 (!) 112 (!) 114 (!) 113  Resp: (!) 24 (!) 27 (!) 24 (!) 25  Temp:    98.6 F (37 C)  TempSrc:    Oral  SpO2: 99% 97% 98% 96%  Weight:      Height:        Intake/Output Summary (Last 24 hours) at 10/17/17 0826 Last data filed at 10/17/17 0800  Gross per 24  hour  Intake           1712.2 ml  Output             3737 ml  Net          -2024.8 ml   Filed Weights   10/15/17 0500 10/16/17 0445 10/17/17 0500  Weight: 207 lb 10.8 oz (94.2 kg) (S) 216 lb 11.4 oz (98.3 kg) 216 lb 11.4 oz (98.3 kg)    Telemetry    Nsr/sinus tachy with PVC's - Personally Reviewed  ECG    none - Personally Reviewed  Physical Exam   GEN: No acute distress.   Neck: 9 cm JVD, right IJ central line Cardiac: RRR, no murmurs, rubs, S3 gallops present.  Respiratory: Rales in the bases. GI: Soft, nontender, non-distended  MS: No edema; No deformity. Neuro:  Nonfocal  Psych: Normal affect   Labs    Chemistry Recent Labs Lab 10/13/17 1045  10/16/17 0521 10/16/17 1600 10/17/17 0303  NA 133*  < > 133* 132* 132*  K 3.7  < > 4.0 3.8 3.8  CL 103  < > 101 100* 99*  CO2 17*  < > 20* 22 23  GLUCOSE 124*  < > 105* 122* 101*  BUN 53*  < > 58* 54* 38*  CREATININE 3.40*  < > 4.58* 4.14* 3.24*  CALCIUM 8.7*  < > 8.5* 8.3* 8.1*  PROT 7.6  --  6.6  --   --   ALBUMIN 3.4*  --  3.0* 2.9* 2.9*  AST 45*  --  18  --   --   ALT 158*  --  72*  --   --   ALKPHOS 78  --  63  --   --   BILITOT 2.1*  --  1.2  --   --   GFRNONAA 18*  < > 13* 15* 20*  GFRAA 21*  < > 15* 17* 23*  ANIONGAP 13  < > 12 10 10   < > = values in this interval not displayed.   Hematology Recent Labs Lab 10/15/17 0350 10/16/17 0521 10/17/17 0303  WBC 9.3 8.2 8.6  RBC 4.86 4.23 4.06*  HGB 13.4 11.4* 11.0*  HCT 39.2 33.9* 32.2*  MCV 80.7 80.1 79.3  MCH 27.6 27.0 27.1  MCHC 34.2 33.6 34.2  RDW 16.2* 15.9* 15.7*  PLT 224 208 180    Cardiac Enzymes Recent Labs Lab 10/13/17 1045 10/13/17 1831 10/13/17 2219 10/14/17 0203  TROPONINI 0.11* 0.09* 0.08* 0.08*   No results for input(s): TROPIPOC in the last 168 hours.   BNP Recent Labs Lab 10/13/17 1045  BNP 3,149.4*     DDimer No results for input(s): DDIMER in the last 168 hours.   Radiology    US Renal  Result Date:  10/16/2017 CLINICAL DATA:  Worsening renal function EXAM: RENAL / URINARY TRACT ULTRASOUND COMPLETE COMPARISON:  12/03/2015 FINDINGS: Right Kidney: Length: 9 cm. Increased cortical echogenicity. No hydronephrosis or mass. Left Kidney: Length: 9 cm. Increased cortical echogenicity. No hydronephrosis. Simple appearing 1 cm cyst. Bladder: Not visible.  A Foley catheter is present. Right pleural effusion that is small based on prior chest radiograph. IMPRESSION: 1. Medical renal disease and symmetric renal atrophy. No hydronephrosis. 2. Small right pleural effusion. Electronically Signed   By: Marnee Spring M.D.   On: 10/16/2017 16:09   Dg Chest Port 1 View  Result Date: 10/16/2017 CLINICAL DATA:  Central catheter placement EXAM: PORTABLE CHEST 1 VIEW COMPARISON:  October 13, 2017 FINDINGS: Central catheter tip is in the superior vena cava. No pneumothorax. There is no edema or consolidation. Heart is mildly enlarged with pulmonary vascularity within normal limits. Pacemaker lead is attached the right ventricle. No adenopathy. No bone lesions. IMPRESSION: Central catheter tip in superior vena cava. No pneumothorax. No edema or consolidation. Stable cardiomegaly. Electronically Signed   By: Bretta Bang III M.D.   On: 10/16/2017 12:21    Cardiac Studies   Co-ox - 52  Patient Profile     56 y.o. male with advanced CHF and VT/VF with multiple ICD shocks and refractory CHF  Assessment & Plan    1. VT/VF - he has had no recurrent sustained VT. He is tolerating IV amiodarone nicely. If his CVVH continues to go well, would switch to oral amio on Monday. Keep IV amio over the weekend.  2. Advanced CHF - he appears to be tolerating CVVH and IV milrinone with volume finally removed. Weight is still up.   Gregg Taylor,M.D.  For questions or updates, please contact [facility] Please consult [email or web address] for contact info under Cardiology/STEMI.      Signed, Lewayne Bunting, MD  10/17/2017, 8:26  AM  Patient ID: Edgar [name], male   DOB: 1961/09/10, 56 y.o.   MRN: [id number]

## 2017-10-17 NOTE — Care Management Note (Addendum)
Case Management Note  Patient Details  Name: Edgar [name] MRN: [id number] Date of Birth: 08/11/1961  Subjective/Objective:  From home,   S/p RHC 10/24 after failure of diureses with worsening renal function and dyspena. Lasix increased to tid, now on CRRT.co ox of 80.6 on milrinone.    10/30 1601 Letha Capeeborah May Ozment RN, BSN -conts on milrinone, CVVHD stopped 10/29,urine picking back up, CVP 12, if renal function does not return consider IHD per MD note, conts on iv lasix, he has very low EF.     11/1 1122 Letha Capeeborah Christain Mcraney RN,BSN - [facility], He will need orders put in with face to face.  His ex wife will be with him at home when he is discharge, she works  8pm to 8am , but he states he can manage while she is at work.                  Action/Plan: NCM will follow for dc needs.   Expected Discharge Date:                  Expected Discharge Plan:     In-House Referral:     Discharge planning Services  CM Consult  Post Acute Care Choice:    Choice offered to:     DME Arranged:    DME Agency:     HH Arranged:    HH Agency:     Status of Service:  In process, will continue to follow  If discussed at Long Length of Stay Meetings, dates discussed:    Additional Comments:  [name], Lenola Lockner Clinton, RN 10/17/2017, 4:18 PM

## 2017-10-18 LAB — RENAL FUNCTION PANEL
ALBUMIN: 2.6 g/dL — AB (ref 3.5–5.0)
ANION GAP: 6 (ref 5–15)
Albumin: 2.7 g/dL — ABNORMAL LOW (ref 3.5–5.0)
Anion gap: 9 (ref 5–15)
BUN: 17 mg/dL (ref 6–20)
BUN: 15 mg/dL (ref 6–20)
CHLORIDE: 99 mmol/L — AB (ref 101–111)
CO2: 25 mmol/L (ref 22–32)
CO2: 27 mmol/L (ref 22–32)
CREATININE: 2.01 mg/dL — AB (ref 0.61–1.24)
Calcium: 8.2 mg/dL — ABNORMAL LOW (ref 8.9–10.3)
Calcium: 8 mg/dL — ABNORMAL LOW (ref 8.9–10.3)
Chloride: 97 mmol/L — ABNORMAL LOW (ref 101–111)
Creatinine, Ser: 1.94 mg/dL — ABNORMAL HIGH (ref 0.61–1.24)
GFR calc Af Amer: 40 mL/min — ABNORMAL LOW (ref >60)
GFR calc Af Amer: 42 mL/min — ABNORMAL LOW (ref >60)
GFR calc non Af Amer: 35 mL/min — ABNORMAL LOW (ref >60)
GFR, EST NON AFRICAN AMERICAN: 36 mL/min — AB (ref >60)
GLUCOSE: 113 mg/dL — AB (ref 65–99)
GLUCOSE: 124 mg/dL — AB (ref 65–99)
PHOSPHORUS: 2.1 mg/dL — AB (ref 2.5–4.6)
POTASSIUM: 4.2 mmol/L (ref 3.5–5.1)
POTASSIUM: 4.1 mmol/L (ref 3.5–5.1)
Phosphorus: 3.4 mg/dL (ref 2.5–4.6)
Sodium: 131 mmol/L — ABNORMAL LOW (ref 135–145)
Sodium: 132 mmol/L — ABNORMAL LOW (ref 135–145)

## 2017-10-18 LAB — MAGNESIUM: MAGNESIUM: 2.2 mg/dL (ref 1.7–2.4)

## 2017-10-18 LAB — POCT ACTIVATED CLOTTING TIME
ACTIVATED CLOTTING TIME: 224 s
ACTIVATED CLOTTING TIME: 213 s
ACTIVATED CLOTTING TIME: 219 s
ACTIVATED CLOTTING TIME: 213 s
ACTIVATED CLOTTING TIME: 202 s
ACTIVATED CLOTTING TIME: 197 s
ACTIVATED CLOTTING TIME: 186 s
ACTIVATED CLOTTING TIME: 191 s
Activated Clotting Time: 213 seconds
Activated Clotting Time: 197 seconds
Activated Clotting Time: 202 seconds
Activated Clotting Time: 191 seconds

## 2017-10-18 LAB — CBC
HEMATOCRIT: 32.3 % — AB (ref 39.0–52.0)
HEMOGLOBIN: 10.8 g/dL — AB (ref 13.0–17.0)
MCH: 26.6 pg (ref 26.0–34.0)
MCHC: 33.4 g/dL (ref 30.0–36.0)
MCV: 79.6 fL (ref 78.0–100.0)
Platelets: 158 10*3/uL (ref 150–400)
RBC: 4.06 MIL/uL — AB (ref 4.22–5.81)
RDW: 16 % — AB (ref 11.5–15.5)
WBC: 10.1 10*3/uL (ref 4.0–10.5)

## 2017-10-18 LAB — COOXEMETRY PANEL
CARBOXYHEMOGLOBIN: 1.5 % (ref 0.5–1.5)
METHEMOGLOBIN: 1.2 % (ref 0.0–1.5)
O2 SAT: 75.2 %
Total hemoglobin: 11 g/dL — ABNORMAL LOW (ref 12.0–16.0)

## 2017-10-18 LAB — APTT: aPTT: >200 seconds (ref 24–36)

## 2017-10-18 MED ORDER — OXYMETAZOLINE HCL 0.05 % NA SOLN
2.0000 | Freq: Two times a day (BID) | NASAL | Status: AC | PRN
  Administered 2017-10-18: 2 via NASAL
  Filled 2017-10-18 (×2): qty 15

## 2017-10-18 MED ORDER — SODIUM PHOSPHATES 45 MMOLE/15ML IV SOLN
30.0000 mmol | Freq: Once | INTRAVENOUS | Status: AC
  Administered 2017-10-18: 30 mmol via INTRAVENOUS
  Filled 2017-10-18: qty 10

## 2017-10-18 MED ORDER — POLYETHYLENE GLYCOL 3350 17 G PO PACK
17.0000 g | PACK | Freq: Every day | ORAL | Status: DC
  Administered 2017-10-18 – 2017-10-26 (×4): 17 g via ORAL
  Filled 2017-10-18 (×7): qty 1

## 2017-10-18 MED ORDER — SORBITOL 70 % PO SOLN: 30.0000 mL | Freq: Every day | ORAL | Status: DC | PRN

## 2017-10-18 NOTE — Progress Notes (Signed)
Critical PTT value greater than 200. Pharmacy notified. Patient is on CRRT with Heparin. No new orders at this time.

## 2017-10-18 NOTE — Progress Notes (Signed)
   10/18/17 1500  Clinical Encounter Type  Visited With Patient and family together  Visit Type Initial  Referral From Nurse  Consult/Referral To Chaplain  Spiritual Encounters  Spiritual Needs Literature  Stress Factors  Patient Stress Factors Health changes  Family Stress Factors Health changes   Received a page from [facility] for a patient that requested information on AD.  Was able to speak with the family and go over some of the details.  Explained that a notary was not available until Monday. Patient will still be here and I will pass on to Chaplain for this floor. Will follow as needed. Chaplain Agustin CreeNewton Adenike Shidler

## 2017-10-18 NOTE — Plan of Care (Signed)
Problem: Fluid Volume: Goal: Ability to maintain a balanced intake and output will improve Able to pull 150-200 cc/hour consistently & patient compliant with 1500 cc fluid restriction; cvp 10 & weight down 4 kg

## 2017-10-18 NOTE — Progress Notes (Addendum)
Advanced Heart Failure Rounding Note   Subjective:    Remains on CVVHD pulling 150/hr. Weight down 9 pounds. Breathing better.   Having epistaxis. Nose packed. Breathing better. Still very weak. SBP stable.   Remains on milrinone  0.25. Coox 75%. CVP 10   RHC 10/16/2017  RA = 20 RV = 53/23 PA = 54/26 (39) PCW = 28 v =35 Fick cardiac output/index = 3.6/1.7 Thermo CO/CI = 7.8/3.6 PVR =1.8 WU SVR = 2022 Ao sat = 99% PA sat = 56%, 56% SVC sat 66%  Objective:   Weight Range:  Vital Signs:   Temp:  [97.5 F (36.4 C)-99.1 F (37.3 C)] 97.5 F (36.4 C) (10/27 1133) Pulse Rate:  [109-125] 114 (10/27 1100) Resp:  [15-30] 18 (10/27 1100) BP: (108-136)/(67-94) 113/81 (10/27 1100) SpO2:  [93 %-99 %] 99 % (10/27 1100) Weight:  [94.1 kg (207 lb 7.3 oz)] 94.1 kg (207 lb 7.3 oz) (10/27 0500) Last BM Date: 10/13/17  Weight change: Filed Weights   10/16/17 0445 10/17/17 0500 10/18/17 0500  Weight: (S) 98.3 kg (216 lb 11.4 oz) 98.3 kg (216 lb 11.4 oz) 94.1 kg (207 lb 7.3 oz)    Intake/Output:   Intake/Output Summary (Last 24 hours) at 10/18/17 1146 Last data filed at 10/18/17 1100  Gross per 24 hour  Intake           1784.2 ml  Output             5107 ml  Net          -3322.8 ml     Physical Exam:  General:  Chronically ill appearing. No resp difficulty HEENT: normal nose packed Neck: supple. RIJ trialysis cath. Carotids 2+ bilat; no bruits. No lymphadenopathy or thryomegaly appreciated. Cor: PMI laterally displaced. Tachy regular +s3 Lungs: clear Abdomen: soft, nontender, + distended. No hepatosplenomegaly. No bruits or masses. Good bowel sounds. Extremities: no cyanosis, clubbing, rash, 1+ edema Neuro: alert & orientedx3, cranial nerves grossly intact. moves all 4 extremities w/o difficulty. Affect pleasant   Telemetry: Sinus tach 100-120s, Personally reviewed.   Labs: Basic Metabolic Panel:  Recent Labs Lab 10/13/17 1831  10/15/17 0350 10/16/17 0521  10/16/17 1600 10/17/17 0303 10/17/17 1545 10/18/17 0518  NA  --   < > 132* 133* 132* 132* 132* 131*  K  --   < > 4.7 4.0 3.8 3.8 4.1 4.2  CL  --   < > 101 101 100* 99* 100* 97*  CO2  --   < > 16* 20* 22 23 25 25   GLUCOSE  --   < > 119* 105* 122* 101* 120* 113*  BUN  --   < > 54* 58* 54* 38* 24* 17  CREATININE  --   < > 3.91* 4.58* 4.14* 3.24* 2.40* 2.01*  CALCIUM  --   < > 8.8* 8.5* 8.3* 8.1* 8.1* 8.2*  MG 2.2  --  2.0 1.9  --  2.2  --  2.2  PHOS  --   --   --   --  4.1 2.9 2.1* 2.1*  < > = values in this interval not displayed.  Liver Function Tests:  Recent Labs Lab 10/13/17 1045 10/16/17 0521 10/16/17 1600 10/17/17 0303 10/17/17 1545 10/18/17 0518  AST 45* 18  --   --   --   --   ALT 158* 72*  --   --   --   --   ALKPHOS 78 63  --   --   --   --  BILITOT 2.1* 1.2  --   --   --   --   PROT 7.6 6.6  --   --   --   --   ALBUMIN 3.4* 3.0* 2.9* 2.9* 2.8* 2.7*   No results for input(s): LIPASE, AMYLASE in the last 168 hours. No results for input(s): AMMONIA in the last 168 hours.  CBC:  Recent Labs Lab 10/13/17 1045 10/14/17 1013 10/15/17 0350 10/16/17 0521 10/17/17 0303 10/18/17 0518  WBC 8.9 8.4 9.3 8.2 8.6 10.1  NEUTROABS 6.8  --   --   --   --   --   HGB 13.4 12.5* 13.4 11.4* 11.0* 10.8*  HCT 39.4 37.4* 39.2 33.9* 32.2* 32.3*  MCV 79.0 79.9 80.7 80.1 79.3 79.6  PLT 243 232 224 208 180 158    Cardiac Enzymes:  Recent Labs Lab 10/13/17 1045 10/13/17 1831 10/13/17 2219 10/14/17 0203  TROPONINI 0.11* 0.09* 0.08* 0.08*    BNP: BNP (last 3 results)  Recent Labs  10/13/17 1045  BNP 3,149.4*    ProBNP (last 3 results) No results for input(s): PROBNP in the last 8760 hours.    Other results:  Imaging: US Renal  Result Date: 10/16/2017 CLINICAL DATA:  Worsening renal function EXAM: RENAL / URINARY TRACT ULTRASOUND COMPLETE COMPARISON:  12/03/2015 FINDINGS: Right Kidney: Length: 9 cm. Increased cortical echogenicity. No hydronephrosis or  mass. Left Kidney: Length: 9 cm. Increased cortical echogenicity. No hydronephrosis. Simple appearing 1 cm cyst. Bladder: Not visible.  A Foley catheter is present. Right pleural effusion that is small based on prior chest radiograph. IMPRESSION: 1. Medical renal disease and symmetric renal atrophy. No hydronephrosis. 2. Small right pleural effusion. Electronically Signed   By: Marnee Spring M.D.   On: 10/16/2017 16:09   Dg Chest Port 1 View  Result Date: 10/16/2017 CLINICAL DATA:  Central catheter placement EXAM: PORTABLE CHEST 1 VIEW COMPARISON:  October 13, 2017 FINDINGS: Central catheter tip is in the superior vena cava. No pneumothorax. There is no edema or consolidation. Heart is mildly enlarged with pulmonary vascularity within normal limits. Pacemaker lead is attached the right ventricle. No adenopathy. No bone lesions. IMPRESSION: Central catheter tip in superior vena cava. No pneumothorax. No edema or consolidation. Stable cardiomegaly. Electronically Signed   By: Bretta Bang III M.D.   On: 10/16/2017 12:21     Medications:     Scheduled Medications: . aspirin EC  81 mg Oral Daily  . calcitRIOL  0.5 mcg Oral Daily  . Chlorhexidine Gluconate Cloth  6 each Topical Daily  . heparin subcutaneous  5,000 Units Subcutaneous Q8H  . isosorbide mononitrate  30 mg Oral Daily  . mouth rinse  15 mL Mouth Rinse BID  . sodium chloride flush  10-40 mL Intracatheter Q12H  . sodium chloride flush  3 mL Intravenous Q12H  . sodium chloride flush  3 mL Intravenous Q12H  . tamsulosin  0.4 mg Oral QPC supper    Infusions: . sodium chloride    . sodium chloride    . amiodarone 30 mg/hr (10/18/17 0700)  . heparin 10,000 units/ 20 mL infusion syringe 1,500 Units/hr (10/18/17 1104)  . milrinone 0.25 mcg/kg/min (10/18/17 1000)  . dialysis replacement fluid (prismasate) 600 mL/hr at 10/18/17 1001  . dialysis replacement fluid (prismasate) 500 mL/hr at 10/18/17 0818  . dialysate (PRISMASATE)  1,500 mL/hr at 10/18/17 0814  . sodium chloride    . sodium phosphate  Dextrose 5% IVPB 30 mmol (10/18/17 0956)  PRN Medications: sodium chloride, sodium chloride, acetaminophen, heparin, heparin, labetalol, ondansetron (ZOFRAN) IV, sodium chloride, sodium chloride flush, sodium chloride flush, sodium chloride flush   Patient Profile   56 y/o male with h/o severe HTN, systolic HF with EF 25-30%, DM, CKD 4 admitted with decompensated HF and recent ICD shock.   Assessment/Plan   1. Acute on chronic systolic HF due to NICM  10/15/17 ECHO EF 20% Grade III DD and Mod MR - cardiac cath 10/17. No CAD - Admitted with marked volume overload and Class IV HF symptoms in setting of recent VT/VF with ICD shock on 09/21/16.  - he remains critically ill. Volume status improving with HD. U/o poo. Only 10/cc hr - co-ox 75% on milrinone will continue - CVP 10. Continue to CVVHD at least one more day,  - not candidate for ACE/ABR/ARNI/spiro with CKD - no b-blocker with acute decompensation. - given CKD IV only durable long-term option will be consideration of heart-kidney transplantation which we have discussed.  -Will continue CVVHD for now. Once he is euvolemic will stop CVVHD and see if renal function returns. If not, will need to consider transfer to [facility] for evaluation of possible heart/kidney transplant if he is a candidate. Doubt he will tolerate outpatient HD well with very low EF.   2. VT/VF - multiple recent events with appropriate ICD therapies.  - Now quiescent Continue amio drip 30 mg per hour.  - Keep K > 4.0 Mg 2.0.   - No driving for 6 months by [place] DMV state law.   3. Elevated troponin - Not ACS. Due to HF. Cath 10/17 no cad - No s/s of ischemia.      4. Acute on chronic renal failure stage 4 - Creatinine baseline 3.0-3.5.  - Now on CVVHD. As above continue to pull 150-200/hr - Says he has seen Nephrology at the [facility] in [place].   5. HTN Continue hydralazine 75 mg tid  + imdur 30 daily as tolerated.   6. Severe mitral regurgitation - likely functional MR. - Moderate MR on repeat Echo.   7. Previous CVA - mild R-sided deficits. PT/OT to see as he recovers.   8. Epistaxis - continue packing. Can use Afrin  9. Constipation -  miralax +/- sorbitol  CRITICAL CARE Performed by: Arvilla Meres  Total critical care time: 35 minutes  Critical care time was exclusive of separately billable procedures and treating other patients.  Critical care was necessary to treat or prevent imminent or life-threatening deterioration.  Critical care was time spent personally by me (independent of midlevel providers or residents) on the following activities: development of treatment plan with patient and/or surrogate as well as nursing, discussions with consultants, evaluation of patient's response to treatment, examination of patient, obtaining history from patient or surrogate, ordering and performing treatments and interventions, ordering and review of laboratory studies, ordering and review of radiographic studies, pulse oximetry and re-evaluation of patient's condition.    Length of Stay: 5  Arvilla Meres, MD  10/18/2017, 11:46 AM  Advanced Heart Failure Team Pager [phone] (M-F; 7a - 4p)  Please contact [facility] for night-coverage after hours (4p -7a ) and weekends on [email or web address]

## 2017-10-18 NOTE — Progress Notes (Signed)
S: 56 y.o.male, a veteran With PMHx significant for nonischemic cardiomyopathy, HFrEF, ICD since 2017, HTN and CKD 4, admitted with worsening dyspnea and lower extremity edema, history of CRRT yesterday.  Patient was feeling very fatigued, his PTT was more than 200 resulting in nasal bleed. He continued to be on CRRT, tolerating it well. Urinary output remains very low.  O:BP (!) 113/94   Pulse (!) 123   Temp 98.6 F (37 C)   Resp (!) 25   Ht 5\' 11"  (1.803 m)   Wt 207 lb 7.3 oz (94.1 kg)   SpO2 97%   BMI 28.93 kg/m   Intake/Output Summary (Last 24 hours) at 10/18/17 1046 Last data filed at 10/18/17 1000  Gross per 24 hour  Intake           1821.1 ml  Output             5112 ml  Net          -3290.9 ml   Intake/Output: I/O last 3 completed shifts: In: 2327.6 [P.O.:1280; I.V.:930.6; IV Piggyback:117] Out: 7080 [Urine:710; Other:6370]  Intake/Output this shift:  Total I/O In: 424.3 [P.O.:360; I.V.:64.3] Out: 909 [Urine:43; Other:866] Weight change: -9 lb 4.1 oz (-4.2 kg)  Gen: well-developed, ill-appearing gentleman, in no acute distress. CVS: tachycardia with regular rate.Gr 2/6 holosys M Resp: few basal crackles on anterior auscultation. Abd: Soft, non tender, bowel sounds positive.  Liver down 5 cm Ext:3+ lower extremity edema up to thighs, improved as compared to yesterday.   Recent Labs Lab 10/13/17 1045 10/14/17 0203 10/15/17 0350 10/16/17 0521 10/16/17 1600 10/17/17 0303 10/17/17 1545 10/18/17 0518  NA 133* 134* 132* 133* 132* 132* 132* 131*  K 3.7 3.3* 4.7 4.0 3.8 3.8 4.1 4.2  CL 103 103 101 101 100* 99* 100* 97*  CO2 17* 19* 16* 20* 22 23 25 25   GLUCOSE 124* 120* 119* 105* 122* 101* 120* 113*  BUN 53* 49* 54* 58* 54* 38* 24* 17  CREATININE 3.40* 3.44* 3.91* 4.58* 4.14* 3.24* 2.40* 2.01*  ALBUMIN 3.4*  --   --  3.0* 2.9* 2.9* 2.8* 2.7*  CALCIUM 8.7* 8.4* 8.8* 8.5* 8.3* 8.1* 8.1* 8.2*  PHOS  --   --   --   --  4.1 2.9 2.1* 2.1*  AST 45*  --   --  18   --   --   --   --   ALT 158*  --   --  72*  --   --   --   --    Liver Function Tests:  Recent Labs Lab 10/13/17 1045 10/16/17 0521  10/17/17 0303 10/17/17 1545 10/18/17 0518  AST 45* 18  --   --   --   --   ALT 158* 72*  --   --   --   --   ALKPHOS 78 63  --   --   --   --   BILITOT 2.1* 1.2  --   --   --   --   PROT 7.6 6.6  --   --   --   --   ALBUMIN 3.4* 3.0*  < > 2.9* 2.8* 2.7*  < > = values in this interval not displayed. No results for input(s): LIPASE, AMYLASE in the last 168 hours. No results for input(s): AMMONIA in the last 168 hours. CBC:  Recent Labs Lab 10/13/17 1045 10/14/17 1013 10/15/17 0350 10/16/17 0521 10/17/17 0303 10/18/17 0518  WBC 8.9 8.4  9.3 8.2 8.6 10.1  NEUTROABS 6.8  --   --   --   --   --   HGB 13.4 12.5* 13.4 11.4* 11.0* 10.8*  HCT 39.4 37.4* 39.2 33.9* 32.2* 32.3*  MCV 79.0 79.9 80.7 80.1 79.3 79.6  PLT 243 232 224 208 180 158   Cardiac Enzymes:  Recent Labs Lab 10/13/17 1045 10/13/17 1831 10/13/17 2219 10/14/17 0203  TROPONINI 0.11* 0.09* 0.08* 0.08*   CBG:  Recent Labs Lab 10/13/17 1530 10/13/17 1946  GLUCAP 99 118*    Iron Studies:  Recent Labs  10/16/17 1600  IRON 13*  TIBC 286  FERRITIN 63   Studies/Results: US Renal  Result Date: 10/16/2017 CLINICAL DATA:  Worsening renal function EXAM: RENAL / URINARY TRACT ULTRASOUND COMPLETE COMPARISON:  12/03/2015 FINDINGS: Right Kidney: Length: 9 cm. Increased cortical echogenicity. No hydronephrosis or mass. Left Kidney: Length: 9 cm. Increased cortical echogenicity. No hydronephrosis. Simple appearing 1 cm cyst. Bladder: Not visible.  A Foley catheter is present. Right pleural effusion that is small based on prior chest radiograph. IMPRESSION: 1. Medical renal disease and symmetric renal atrophy. No hydronephrosis. 2. Small right pleural effusion. Electronically Signed   By: Marnee Spring M.D.   On: 10/16/2017 16:09   Dg Chest Port 1 View  Result Date:  10/16/2017 CLINICAL DATA:  Central catheter placement EXAM: PORTABLE CHEST 1 VIEW COMPARISON:  October 13, 2017 FINDINGS: Central catheter tip is in the superior vena cava. No pneumothorax. There is no edema or consolidation. Heart is mildly enlarged with pulmonary vascularity within normal limits. Pacemaker lead is attached the right ventricle. No adenopathy. No bone lesions. IMPRESSION: Central catheter tip in superior vena cava. No pneumothorax. No edema or consolidation. Stable cardiomegaly. Electronically Signed   By: Bretta Bang III M.D.   On: 10/16/2017 12:21   . aspirin EC  81 mg Oral Daily  . calcitRIOL  0.5 mcg Oral Daily  . Chlorhexidine Gluconate Cloth  6 each Topical Daily  . heparin subcutaneous  5,000 Units Subcutaneous Q8H  . isosorbide mononitrate  30 mg Oral Daily  . mouth rinse  15 mL Mouth Rinse BID  . sodium chloride flush  10-40 mL Intracatheter Q12H  . sodium chloride flush  3 mL Intravenous Q12H  . sodium chloride flush  3 mL Intravenous Q12H  . tamsulosin  0.4 mg Oral QPC supper    BMET    Component Value Date/Time   NA 131 (L) 10/18/2017 0518   K 4.2 10/18/2017 0518   CL 97 (L) 10/18/2017 0518   CO2 25 10/18/2017 0518   GLUCOSE 113 (H) 10/18/2017 0518   BUN 17 10/18/2017 0518   CREATININE 2.01 (H) 10/18/2017 0518   CALCIUM 8.2 (L) 10/18/2017 0518   GFRNONAA 35 (L) 10/18/2017 0518   GFRAA 40 (L) 10/18/2017 0518   CBC    Component Value Date/Time   WBC 10.1 10/18/2017 0518   RBC 4.06 (L) 10/18/2017 0518   HGB 10.8 (L) 10/18/2017 0518   HCT 32.3 (L) 10/18/2017 0518   PLT 158 10/18/2017 0518   MCV 79.6 10/18/2017 0518   MCH 26.6 10/18/2017 0518   MCHC 33.4 10/18/2017 0518   RDW 16.0 (H) 10/18/2017 0518   LYMPHSABS 1.4 10/13/2017 1045   MONOABS 0.6 10/13/2017 1045   EOSABS 0.1 10/13/2017 1045   BASOSABS 0.0 10/13/2017 1045     Assessment/Plan:  AKI with CKD 4. Creatinine improving on CRRT,able to remove more than 3 L of fluid since  yesterday, hardly making any urine. -Continue CRRT at the current rate of 150-200 mL per hour net Neg. Goal is to remove approximately 3600 mL of fluid in next 24 over, total output is above 5 L so far, weight decreased to 207 from 216 today. -plan is to remove up to 10-12 L before stopping CRRT, most likely in next 2 days. -Heparin dose was decreased, new goal for PTT is 180-200. -hyponatremia should improve with CRRT Good solute, acid/base control with CRRT, and improving vol   Hypophosphatemia. Replace phosphate, most likely due to CRRT.  HFrEF. Heart failure team is following.Currently on milrinone. Removal of external volume should help improve his cardiac function.  Hypertension. Normotensive today.Hydralazine was discontinued. -Keep monitoring blood pressure, avoid hypotension.  Anemia.Currently hemoglobin stable. Feraheme was given yesterday. -continue monitoring.  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 -Calcitriol 0.5 per day. Arrhythmias  Hopefully lower vent and atrial stress should help.  I have seen and examined this patient and agree with the plan of care seen, eval, examined, patient counseled. Discussed with nursing staff and resident. .  Delorean Knutzen L 10/18/2017, 4:09 PM

## 2017-10-19 LAB — RENAL FUNCTION PANEL
Albumin: 2.5 g/dL — ABNORMAL LOW (ref 3.5–5.0)
Albumin: 2.6 g/dL — ABNORMAL LOW (ref 3.5–5.0)
Anion gap: 9 (ref 5–15)
Anion gap: 10 (ref 5–15)
BUN: 15 mg/dL (ref 6–20)
BUN: 12 mg/dL (ref 6–20)
CALCIUM: 8.1 mg/dL — AB (ref 8.9–10.3)
CALCIUM: 8.3 mg/dL — AB (ref 8.9–10.3)
CHLORIDE: 98 mmol/L — AB (ref 101–111)
CO2: 26 mmol/L (ref 22–32)
CO2: 25 mmol/L (ref 22–32)
CREATININE: 1.89 mg/dL — AB (ref 0.61–1.24)
CREATININE: 1.72 mg/dL — AB (ref 0.61–1.24)
Chloride: 96 mmol/L — ABNORMAL LOW (ref 101–111)
GFR calc non Af Amer: 38 mL/min — ABNORMAL LOW (ref >60)
GFR, EST AFRICAN AMERICAN: 43 mL/min — AB (ref >60)
GFR, EST AFRICAN AMERICAN: 49 mL/min — AB (ref >60)
GFR, EST NON AFRICAN AMERICAN: 42 mL/min — AB (ref >60)
GLUCOSE: 142 mg/dL — AB (ref 65–99)
Glucose, Bld: 146 mg/dL — ABNORMAL HIGH (ref 65–99)
Phosphorus: 2.3 mg/dL — ABNORMAL LOW (ref 2.5–4.6)
Phosphorus: 3.9 mg/dL (ref 2.5–4.6)
Potassium: 4.3 mmol/L (ref 3.5–5.1)
Potassium: 4.1 mmol/L (ref 3.5–5.1)
SODIUM: 131 mmol/L — AB (ref 135–145)
SODIUM: 133 mmol/L — AB (ref 135–145)

## 2017-10-19 LAB — CBC
HCT: 30.3 % — ABNORMAL LOW (ref 39.0–52.0)
HEMOGLOBIN: 10.2 g/dL — AB (ref 13.0–17.0)
MCH: 26.8 pg (ref 26.0–34.0)
MCHC: 33.7 g/dL (ref 30.0–36.0)
MCV: 79.5 fL (ref 78.0–100.0)
PLATELETS: 154 10*3/uL (ref 150–400)
RBC: 3.81 MIL/uL — AB (ref 4.22–5.81)
RDW: 15.8 % — ABNORMAL HIGH (ref 11.5–15.5)
WBC: 10.6 10*3/uL — AB (ref 4.0–10.5)

## 2017-10-19 LAB — COOXEMETRY PANEL
Carboxyhemoglobin: 1.5 % (ref 0.5–1.5)
METHEMOGLOBIN: 1.3 % (ref 0.0–1.5)
O2 SAT: 84.9 %
TOTAL HEMOGLOBIN: 9.8 g/dL — AB (ref 12.0–16.0)

## 2017-10-19 LAB — POCT ACTIVATED CLOTTING TIME
ACTIVATED CLOTTING TIME: 169 s
ACTIVATED CLOTTING TIME: 175 s
ACTIVATED CLOTTING TIME: 175 s
ACTIVATED CLOTTING TIME: 175 s
ACTIVATED CLOTTING TIME: 180 s
ACTIVATED CLOTTING TIME: 186 s
ACTIVATED CLOTTING TIME: 186 s
ACTIVATED CLOTTING TIME: 191 s
Activated Clotting Time: 180 seconds
Activated Clotting Time: 180 seconds
Activated Clotting Time: 175 seconds
Activated Clotting Time: 191 seconds
Activated Clotting Time: 186 seconds

## 2017-10-19 LAB — APTT: APTT: 96 s — AB (ref 24–36)

## 2017-10-19 LAB — MAGNESIUM: Magnesium: 2.4 mg/dL (ref 1.7–2.4)

## 2017-10-19 MED ORDER — SODIUM PHOSPHATES 45 MMOLE/15ML IV SOLN
30.0000 mmol | Freq: Once | INTRAVENOUS | Status: AC
  Administered 2017-10-19: 30 mmol via INTRAVENOUS
  Filled 2017-10-19: qty 10

## 2017-10-19 NOTE — Progress Notes (Signed)
S:56 y.o.male, a veteran With PMHx significant for nonischemic cardiomyopathy, HFrEF, ICD since 2017, HTN and CKD 4, admitted with worsening dyspnea and lower extremity edema, started on CRRT.  Patient was feeling much better when seen this morning, shortness of breath has been improved, denies any chest pain.no more epistaxis.   O:BP (!) 133/94   Pulse (!) 115   Temp 98.4 F (36.9 C) (Oral)   Resp 20   Ht 5\' 11"  (1.803 m)   Wt 203 lb 7.8 oz (92.3 kg)   SpO2 95%   BMI 28.38 kg/m   Intake/Output Summary (Last 24 hours) at 10/19/17 1621 Last data filed at 10/19/17 1600  Gross per 24 hour  Intake          1213.01 ml  Output             6457 ml  Net         -5243.99 ml   Intake/Output: I/O last 3 completed shifts: In: 2721.8 [P.O.:1650; I.V.:856.8; IV Piggyback:215] Out: 9586 [Urine:380; Other:9206]  Intake/Output this shift:  Total I/O In: 436 [P.O.:200; I.V.:176; Other:60] Out: 2141 [Urine:40; Other:2101] Weight change: -3 lb 15.5 oz (-1.8 kg)  Gen: well-developed gentleman, in no acute distress. CVS: tachycardia with regular rate. Resp:very few basal crackles . Abd: Soft, non tender, bowel sounds positive.  Ext: 2+ lower extremity edema up to thighs, improved as compared to yesterday.  Recent Labs Lab 10/13/17 1045  10/16/17 0521 10/16/17 1600 10/17/17 0303 10/17/17 1545 10/18/17 0518 10/18/17 1556 10/19/17 0500  NA 133*  < > 133* 132* 132* 132* 131* 132* 131*  K 3.7  < > 4.0 3.8 3.8 4.1 4.2 4.1 4.3  CL 103  < > 101 100* 99* 100* 97* 99* 96*  CO2 17*  < > 20* 22 23 25 25 27 26   GLUCOSE 124*  < > 105* 122* 101* 120* 113* 124* 142*  BUN 53*  < > 58* 54* 38* 24* 17 15 15   CREATININE 3.40*  < > 4.58* 4.14* 3.24* 2.40* 2.01* 1.94* 1.89*  ALBUMIN 3.4*  --  3.0* 2.9* 2.9* 2.8* 2.7* 2.6* 2.5*  CALCIUM 8.7*  < > 8.5* 8.3* 8.1* 8.1* 8.2* 8.0* 8.1*  PHOS  --   --   --  4.1 2.9 2.1* 2.1* 3.4 2.3*  AST 45*  --  18  --   --   --   --   --   --   ALT 158*  --  72*  --    --   --   --   --   --   < > = values in this interval not displayed. Liver Function Tests:  Recent Labs Lab 10/13/17 1045 10/16/17 0521  10/18/17 0518 10/18/17 1556 10/19/17 0500  AST 45* 18  --   --   --   --   ALT 158* 72*  --   --   --   --   ALKPHOS 78 63  --   --   --   --   BILITOT 2.1* 1.2  --   --   --   --   PROT 7.6 6.6  --   --   --   --   ALBUMIN 3.4* 3.0*  < > 2.7* 2.6* 2.5*  < > = values in this interval not displayed. No results for input(s): LIPASE, AMYLASE in the last 168 hours. No results for input(s): AMMONIA in the last 168 hours. CBC:  Recent Labs  Lab 10/13/17 1045  10/15/17 0350 10/16/17 0521 10/17/17 0303 10/18/17 0518 10/19/17 0426  WBC 8.9  < > 9.3 8.2 8.6 10.1 10.6*  NEUTROABS 6.8  --   --   --   --   --   --   HGB 13.4  < > 13.4 11.4* 11.0* 10.8* 10.2*  HCT 39.4  < > 39.2 33.9* 32.2* 32.3* 30.3*  MCV 79.0  < > 80.7 80.1 79.3 79.6 79.5  PLT 243  < > 224 208 180 158 154  < > = values in this interval not displayed. Cardiac Enzymes:  Recent Labs Lab 10/13/17 1045 10/13/17 1831 10/13/17 2219 10/14/17 0203  TROPONINI 0.11* 0.09* 0.08* 0.08*   CBG:  Recent Labs Lab 10/13/17 1530 10/13/17 1946  GLUCAP 99 118*    Iron Studies: No results for input(s): IRON, TIBC, TRANSFERRIN, FERRITIN in the last 72 hours. Studies/Results: No results found. Marland Kitchen aspirin EC  81 mg Oral Daily  . calcitRIOL  0.5 mcg Oral Daily  . Chlorhexidine Gluconate Cloth  6 each Topical Daily  . heparin subcutaneous  5,000 Units Subcutaneous Q8H  . mouth rinse  15 mL Mouth Rinse BID  . polyethylene glycol  17 g Oral Daily  . sodium chloride flush  10-40 mL Intracatheter Q12H  . sodium chloride flush  3 mL Intravenous Q12H  . sodium chloride flush  3 mL Intravenous Q12H  . tamsulosin  0.4 mg Oral QPC supper    BMET    Component Value Date/Time   NA 131 (L) 10/19/2017 0500   K 4.3 10/19/2017 0500   CL 96 (L) 10/19/2017 0500   CO2 26 10/19/2017 0500    GLUCOSE 142 (H) 10/19/2017 0500   BUN 15 10/19/2017 0500   CREATININE 1.89 (H) 10/19/2017 0500   CALCIUM 8.1 (L) 10/19/2017 0500   GFRNONAA 38 (L) 10/19/2017 0500   GFRAA 43 (L) 10/19/2017 0500   CBC    Component Value Date/Time   WBC 10.6 (H) 10/19/2017 0426   RBC 3.81 (L) 10/19/2017 0426   HGB 10.2 (L) 10/19/2017 0426   HCT 30.3 (L) 10/19/2017 0426   PLT 154 10/19/2017 0426   MCV 79.5 10/19/2017 0426   MCH 26.8 10/19/2017 0426   MCHC 33.7 10/19/2017 0426   RDW 15.8 (H) 10/19/2017 0426   LYMPHSABS 1.4 10/13/2017 1045   MONOABS 0.6 10/13/2017 1045   EOSABS 0.1 10/13/2017 1045   BASOSABS 0.0 10/13/2017 1045     Assessment/Plan:  AKI with CKD 4. Volume status improving with CRRT, patient feeling much better, shortness of breath has been improved. Has strained both and 11L with a weight loss of 13 pound. Patient remained oliguric. -Continue CRRT for 1 more day with a goal ultrafiltration of 5 L. He might need intermittent dialysis after that. Mechanics ok, keeping ACT low, no more bleeding. Good control of solute, acid/base/K  Hypophosphatemia. Replace phosphate, most likely due to CRRT.  HFrEF. Heart failure team is following.Currently on milrinone.Volume status improving, cardiology thinking of sending him to do for a possible cardiac renal transplant, as he might not be able to tolerate outpatient hemodialysis because of his ejection fraction of 20%.  Hypertension. Normotensive today.Hydralazine was discontinued. -Keep monitoring blood pressure, avoid hypotension.  Anemia.Currently hemoglobin stable. Feraheme was given yesterday. -continue monitoring.  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 -Calcitriol 0.5 per day.  Arrhythmia. Had a small run of SVT. Cardiology is monitoring. Patient is currently on amiodarone infusion.Vol stress a role  I have seen and examined  this patient and agree with the plan of care seen, examined, counseled, eval, discussed with  resident .  Rainey Kahrs L 10/19/2017, 4:35 PM

## 2017-10-19 NOTE — Progress Notes (Addendum)
Advanced Heart Failure Rounding Note   Subjective:    Remains on CVVHD pulling 150/hr. Weight down another 4 pounds. (13 pounds total) Breathing better.   Remains on milrinone  0.25. Coox 5%. CVP 8. No further epistaxis. BP stable  RHC 10/16/2017  RA = 20 RV = 53/23 PA = 54/26 (39) PCW = 28 v =35 Fick cardiac output/index = 3.6/1.7 Thermo CO/CI = 7.8/3.6 PVR =1.8 WU SVR = 2022 Ao sat = 99% PA sat = 56%, 56% SVC sat 66%  Objective:   Weight Range:  Vital Signs:   Temp:  [97.5 F (36.4 C)-99.1 F (37.3 C)] 97.6 F (36.4 C) (10/28 0742) Pulse Rate:  [109-121] 115 (10/28 1000) Resp:  [14-32] 23 (10/28 1000) BP: (109-141)/(61-94) 129/87 (10/28 1000) SpO2:  [93 %-100 %] 94 % (10/28 1000) Weight:  [92.3 kg (203 lb 7.8 oz)] 92.3 kg (203 lb 7.8 oz) (10/28 0420) Last BM Date: 10/13/17  Weight change: Filed Weights   10/17/17 0500 10/18/17 0500 10/19/17 0420  Weight: 98.3 kg (216 lb 11.4 oz) 94.1 kg (207 lb 7.3 oz) 92.3 kg (203 lb 7.8 oz)    Intake/Output:   Intake/Output Summary (Last 24 hours) at 10/19/17 1058 Last data filed at 10/19/17 1000  Gross per 24 hour  Intake          1796.21 ml  Output             6981 ml  Net         -5184.79 ml     Physical Exam:  General:  Lying in bed. No resp difficulty HEENT: normalanicteric Neck: supple. RIJ trialysis cath. Carotids 2+ bilat; no bruits. No lymphadenopathy or thryomegaly appreciated. Cor: PMI laterally displaced. Tachy regular +s3 Lungs: clear  Abdomen: soft, NT/ND good BS. No HSM  Extremities: no cyanosis, clubbing, rash, edema Neuro: alert & oriented x 3, cranial nerves grossly intact. moves all 4 extremities w/o difficulty. Affect pleasant   Telemetry: Sinus tach 110s, 5-beat run NSVT Personally reviewed.   Labs: Basic Metabolic Panel:  Recent Labs Lab 10/15/17 0350 10/16/17 0521  10/17/17 0303 10/17/17 1545 10/18/17 0518 10/18/17 1556 10/19/17 0426 10/19/17 0500  NA 132* 133*  < > 132*  132* 131* 132*  --  131*  K 4.7 4.0  < > 3.8 4.1 4.2 4.1  --  4.3  CL 101 101  < > 99* 100* 97* 99*  --  96*  CO2 16* 20*  < > 23 25 25 27   --  26  GLUCOSE 119* 105*  < > 101* 120* 113* 124*  --  142*  BUN 54* 58*  < > 38* 24* 17 15  --  15  CREATININE 3.91* 4.58*  < > 3.24* 2.40* 2.01* 1.94*  --  1.89*  CALCIUM 8.8* 8.5*  < > 8.1* 8.1* 8.2* 8.0*  --  8.1*  MG 2.0 1.9  --  2.2  --  2.2  --  2.4  --   PHOS  --   --   < > 2.9 2.1* 2.1* 3.4  --  2.3*  < > = values in this interval not displayed.  Liver Function Tests:  Recent Labs Lab 10/13/17 1045 10/16/17 0521  10/17/17 0303 10/17/17 1545 10/18/17 0518 10/18/17 1556 10/19/17 0500  AST 45* 18  --   --   --   --   --   --   ALT 158* 72*  --   --   --   --   --   --  ALKPHOS 78 63  --   --   --   --   --   --   BILITOT 2.1* 1.2  --   --   --   --   --   --   PROT 7.6 6.6  --   --   --   --   --   --   ALBUMIN 3.4* 3.0*  < > 2.9* 2.8* 2.7* 2.6* 2.5*  < > = values in this interval not displayed. No results for input(s): LIPASE, AMYLASE in the last 168 hours. No results for input(s): AMMONIA in the last 168 hours.  CBC:  Recent Labs Lab 10/13/17 1045  10/15/17 0350 10/16/17 0521 10/17/17 0303 10/18/17 0518 10/19/17 0426  WBC 8.9  < > 9.3 8.2 8.6 10.1 10.6*  NEUTROABS 6.8  --   --   --   --   --   --   HGB 13.4  < > 13.4 11.4* 11.0* 10.8* 10.2*  HCT 39.4  < > 39.2 33.9* 32.2* 32.3* 30.3*  MCV 79.0  < > 80.7 80.1 79.3 79.6 79.5  PLT 243  < > 224 208 180 158 154  < > = values in this interval not displayed.  Cardiac Enzymes:  Recent Labs Lab 10/13/17 1045 10/13/17 1831 10/13/17 2219 10/14/17 0203  TROPONINI 0.11* 0.09* 0.08* 0.08*    BNP: BNP (last 3 results)  Recent Labs  10/13/17 1045  BNP 3,149.4*    ProBNP (last 3 results) No results for input(s): PROBNP in the last 8760 hours.    Other results:  Imaging: No results found.   Medications:     Scheduled Medications: . aspirin EC  81 mg  Oral Daily  . calcitRIOL  0.5 mcg Oral Daily  . Chlorhexidine Gluconate Cloth  6 each Topical Daily  . heparin subcutaneous  5,000 Units Subcutaneous Q8H  . isosorbide mononitrate  30 mg Oral Daily  . mouth rinse  15 mL Mouth Rinse BID  . polyethylene glycol  17 g Oral Daily  . sodium chloride flush  10-40 mL Intracatheter Q12H  . sodium chloride flush  3 mL Intravenous Q12H  . sodium chloride flush  3 mL Intravenous Q12H  . tamsulosin  0.4 mg Oral QPC supper    Infusions: . sodium chloride    . sodium chloride    . amiodarone 30 mg/hr (10/19/17 1000)  . heparin 10,000 units/ 20 mL infusion syringe 1,450 Units/hr (10/19/17 0901)  . milrinone 0.25 mcg/kg/min (10/19/17 1000)  . dialysis replacement fluid (prismasate) 600 mL/hr at 10/19/17 0230  . dialysis replacement fluid (prismasate) 500 mL/hr at 10/19/17 0414  . dialysate (PRISMASATE) 1,500 mL/hr at 10/19/17 0814  . sodium chloride    . sodium phosphate  Dextrose 5% IVPB 30 mmol (10/19/17 0901)    PRN Medications: sodium chloride, sodium chloride, acetaminophen, heparin, heparin, labetalol, ondansetron (ZOFRAN) IV, oxymetazoline, sodium chloride, sodium chloride flush, sodium chloride flush, sodium chloride flush, sorbitol   Patient Profile   56 y/o male with h/o severe HTN, systolic HF with EF 25-30%, DM, CKD 4 admitted with decompensated HF and recent ICD shock.   Assessment/Plan   1. Acute on chronic systolic HF due to NICM  10/15/17 ECHO EF 20% Grade III DD and Mod MR - cardiac cath 10/17. No CAD - Admitted with marked volume overload and Class IV HF symptoms in setting of recent VT/VF with ICD shock on 09/21/16.  - he remains critically ill. Volume status improving  with HD. U/o remains poor. ~10/cc hr - co-ox 75% on milrinone will decrease to 0.125 - CVP 8. Discussed with Renal. Will continue to CVVHD one more day then stop,  - not candidate for ACE/ABR/ARNI/spiro with CKD - no b-blocker with acute decompensation. -  given CKD IV only durable long-term option will be consideration of heart-kidney transplantation which we have discussed.  -Will continue CVVHD one more day then stop. If renal function does not return will need to consider transfer to [facility] for evaluation of possible heart/kidney transplant if he is a candidate. Doubt he will tolerate outpatient HD well with very low EF.   2. VT/VF - multiple recent events with appropriate ICD therapies.  - 5 beat NSVT yesterday Continue amio drip 30 mg per hour.  - Keep K > 4.0 Mg 2.0.   - No driving for 6 months by [place] DMV state law.   3. Elevated troponin - Not ACS. Due to HF. Cath 10/17 no cad - No s/s of ischemia.      4. Acute on chronic renal failure stage 4 - Creatinine baseline 3.0-3.5.  - Now on CVVHD. As above continue to pull 150-200/hr - Says he has seen Nephrology at the [facility] in [place].   5. HTN - Off hydralazine & imdur due to low BP on CVVHD.  6. Severe mitral regurgitation - likely functional MR. - Moderate MR on repeat Echo.   7. Previous CVA - mild R-sided deficits. PT/OT to see as he recovers.   8. Epistaxis - Resolved. Afrin PRN   9. Constipation -  Continue miralax +/- sorbitol  Length of Stay: 6  Arvilla Meres, MD  10/19/2017, 10:58 AM  Advanced Heart Failure Team Pager [phone] (M-F; 7a - 4p)  Please contact [facility] for night-coverage after hours (4p -7a ) and weekends on [email or web address]

## 2017-10-20 LAB — RENAL FUNCTION PANEL
Albumin: 2.7 g/dL — ABNORMAL LOW (ref 3.5–5.0)
Albumin: 2.5 g/dL — ABNORMAL LOW (ref 3.5–5.0)
Anion gap: 7 (ref 5–15)
Anion gap: 9 (ref 5–15)
BUN: 11 mg/dL (ref 6–20)
BUN: 18 mg/dL (ref 6–20)
CHLORIDE: 96 mmol/L — AB (ref 101–111)
CO2: 26 mmol/L (ref 22–32)
CO2: 26 mmol/L (ref 22–32)
Calcium: 8.3 mg/dL — ABNORMAL LOW (ref 8.9–10.3)
Calcium: 8.4 mg/dL — ABNORMAL LOW (ref 8.9–10.3)
Chloride: 98 mmol/L — ABNORMAL LOW (ref 101–111)
Creatinine, Ser: 1.76 mg/dL — ABNORMAL HIGH (ref 0.61–1.24)
Creatinine, Ser: 2.59 mg/dL — ABNORMAL HIGH (ref 0.61–1.24)
GFR calc Af Amer: 47 mL/min — ABNORMAL LOW (ref >60)
GFR calc non Af Amer: 41 mL/min — ABNORMAL LOW (ref >60)
GFR, EST AFRICAN AMERICAN: 30 mL/min — AB (ref >60)
GFR, EST NON AFRICAN AMERICAN: 26 mL/min — AB (ref >60)
GLUCOSE: 90 mg/dL (ref 65–99)
Glucose, Bld: 104 mg/dL — ABNORMAL HIGH (ref 65–99)
POTASSIUM: 4.6 mmol/L (ref 3.5–5.1)
POTASSIUM: 4.1 mmol/L (ref 3.5–5.1)
Phosphorus: 2.7 mg/dL (ref 2.5–4.6)
Phosphorus: 3.7 mg/dL (ref 2.5–4.6)
Sodium: 131 mmol/L — ABNORMAL LOW (ref 135–145)
Sodium: 131 mmol/L — ABNORMAL LOW (ref 135–145)

## 2017-10-20 LAB — COOXEMETRY PANEL
CARBOXYHEMOGLOBIN: 2 % — AB (ref 0.5–1.5)
Methemoglobin: 0.9 % (ref 0.0–1.5)
O2 SAT: 73.6 %
Total hemoglobin: 9.9 g/dL — ABNORMAL LOW (ref 12.0–16.0)

## 2017-10-20 LAB — CBC
HEMATOCRIT: 32.2 % — AB (ref 39.0–52.0)
Hemoglobin: 10.7 g/dL — ABNORMAL LOW (ref 13.0–17.0)
MCH: 26.4 pg (ref 26.0–34.0)
MCHC: 33.2 g/dL (ref 30.0–36.0)
MCV: 79.3 fL (ref 78.0–100.0)
PLATELETS: 165 10*3/uL (ref 150–400)
RBC: 4.06 MIL/uL — ABNORMAL LOW (ref 4.22–5.81)
RDW: 16.1 % — AB (ref 11.5–15.5)
WBC: 10.4 10*3/uL (ref 4.0–10.5)

## 2017-10-20 LAB — POCT ACTIVATED CLOTTING TIME
ACTIVATED CLOTTING TIME: 169 s
ACTIVATED CLOTTING TIME: 175 s
Activated Clotting Time: 191 seconds

## 2017-10-20 LAB — APTT: aPTT: 110 seconds — ABNORMAL HIGH (ref 24–36)

## 2017-10-20 LAB — MAGNESIUM: Magnesium: 2.3 mg/dL (ref 1.7–2.4)

## 2017-10-20 MED ORDER — HEPARIN SODIUM (PORCINE) 1000 UNIT/ML DIALYSIS
1000.0000 [IU] | INTRAMUSCULAR | Status: DC | PRN
  Filled 2017-10-20: qty 1

## 2017-10-20 MED ORDER — ALTEPLASE 2 MG IJ SOLR: 2.0000 mg | Freq: Once | INTRAMUSCULAR | Status: DC | PRN

## 2017-10-20 MED ORDER — AMIODARONE HCL 200 MG PO TABS
200.0000 mg | ORAL_TABLET | Freq: Two times a day (BID) | ORAL | Status: DC
  Administered 2017-10-20 – 2017-10-28 (×17): 200 mg via ORAL
  Filled 2017-10-20 (×17): qty 1

## 2017-10-20 MED ORDER — HEPARIN SODIUM (PORCINE) 1000 UNIT/ML DIALYSIS
20.0000 [IU]/kg | INTRAMUSCULAR | Status: DC | PRN
  Filled 2017-10-20: qty 2

## 2017-10-20 MED ORDER — LIDOCAINE-PRILOCAINE 2.5-2.5 % EX CREA
1.0000 "application " | TOPICAL_CREAM | CUTANEOUS | Status: DC | PRN
  Filled 2017-10-20: qty 5

## 2017-10-20 MED ORDER — PENTAFLUOROPROP-TETRAFLUOROETH EX AERO: 1.0000 "application " | INHALATION_SPRAY | CUTANEOUS | Status: DC | PRN

## 2017-10-20 MED ORDER — SODIUM CHLORIDE 0.9 % IV SOLN: 100.0000 mL | INTRAVENOUS | Status: DC | PRN

## 2017-10-20 MED ORDER — LIDOCAINE HCL (PF) 1 % IJ SOLN: 5.0000 mL | INTRAMUSCULAR | Status: DC | PRN

## 2017-10-20 NOTE — Progress Notes (Signed)
Answered a page to this patient regarding Advanced Directive.  Answered wife's questions.  Will plan to complete in the morning once she has completed with notary and volunteers.    10/20/17 1622  Clinical Encounter Type  Visited With Patient;Family (wife at bedside)  Visit Type Follow-up;Psychological support;Spiritual support

## 2017-10-20 NOTE — Progress Notes (Signed)
Advanced Heart Failure Rounding Note   Subjective:    Yesterday milrinone cut back to 0.125 mcg. CO-OX is 74%. Made 280 cc urine. Stopped CVVHD toay. Overall weight down 26 pounds.   Overall feeling better. Denies SOB.   RHC 10/16/2017  RA = 20 RV = 53/23 PA = 54/26 (39) PCW = 28 v =35 Fick cardiac output/index = 3.6/1.7 Thermo CO/CI = 7.8/3.6 PVR =1.8 WU SVR = 2022 Ao sat = 99% PA sat = 56%, 56% SVC sat 66%  Objective:   Weight Range:  Vital Signs:   Temp:  [98 F (36.7 C)-99 F (37.2 C)] 99 F (37.2 C) (10/29 0413) Pulse Rate:  [107-122] 116 (10/29 0700) Resp:  [16-35] 29 (10/29 0700) BP: (121-139)/(81-103) 130/98 (10/29 0700) SpO2:  [90 %-99 %] 97 % (10/29 0700) Weight:  [190 lb 14.7 oz (86.6 kg)] 190 lb 14.7 oz (86.6 kg) (10/29 0413) Last BM Date: 10/13/17  Weight change: Filed Weights   10/18/17 0500 10/19/17 0420 10/20/17 0413  Weight: 207 lb 7.3 oz (94.1 kg) 203 lb 7.8 oz (92.3 kg) 190 lb 14.7 oz (86.6 kg)    Intake/Output:   Intake/Output Summary (Last 24 hours) at 10/20/17 0755 Last data filed at 10/20/17 0700  Gross per 24 hour  Intake           1509.2 ml  Output             5998 ml  Net          -4488.8 ml     Physical Exam: CVP 10-11 General:  Appears fatigued.  No resp difficulty HEENT: normal Neck: supple. RIJ HD cath. Carotids 2+ bilat; no bruits. No lymphadenopathy or thryomegaly appreciated. Cor: PMI nondisplaced. Regular rate & rhythm. No rubs, or murmurs. +S3  Lungs: clear Abdomen: soft, nontender, nondistended. No hepatosplenomegaly. No bruits or masses. Good bowel sounds. Extremities: no cyanosis, clubbing, rash, edema Neuro: alert & orientedx3, cranial nerves grossly intact. moves all 4 extremities w/o difficulty. Affect flat    Telemetry: Sinus Tach 110s with PVCs. Personally reviewed.   Labs: Basic Metabolic Panel:  Recent Labs Lab 10/16/17 0521  10/17/17 0303  10/18/17 0518 10/18/17 1556 10/19/17 0426  10/19/17 0500 10/19/17 1558 10/20/17 0440  NA 133*  < > 132*  < > 131* 132*  --  131* 133* 131*  K 4.0  < > 3.8  < > 4.2 4.1  --  4.3 4.1 4.6  CL 101  < > 99*  < > 97* 99*  --  96* 98* 98*  CO2 20*  < > 23  < > 25 27  --  26 25 26   GLUCOSE 105*  < > 101*  < > 113* 124*  --  142* 146* 90  BUN 58*  < > 38*  < > 17 15  --  15 12 11   CREATININE 4.58*  < > 3.24*  < > 2.01* 1.94*  --  1.89* 1.72* 1.76*  CALCIUM 8.5*  < > 8.1*  < > 8.2* 8.0*  --  8.1* 8.3* 8.3*  MG 1.9  --  2.2  --  2.2  --  2.4  --   --  2.3  PHOS  --   < > 2.9  < > 2.1* 3.4  --  2.3* 3.9 2.7  < > = values in this interval not displayed.  Liver Function Tests:  Recent Labs Lab 10/13/17 1045 10/16/17 0521  10/18/17 0518 10/18/17 1556 10/19/17  0500 10/19/17 1558 10/20/17 0440  AST 45* 18  --   --   --   --   --   --   ALT 158* 72*  --   --   --   --   --   --   ALKPHOS 78 63  --   --   --   --   --   --   BILITOT 2.1* 1.2  --   --   --   --   --   --   PROT 7.6 6.6  --   --   --   --   --   --   ALBUMIN 3.4* 3.0*  < > 2.7* 2.6* 2.5* 2.6* 2.7*  < > = values in this interval not displayed. No results for input(s): LIPASE, AMYLASE in the last 168 hours. No results for input(s): AMMONIA in the last 168 hours.  CBC:  Recent Labs Lab 10/13/17 1045  10/16/17 0521 10/17/17 0303 10/18/17 0518 10/19/17 0426 10/20/17 0440  WBC 8.9  < > 8.2 8.6 10.1 10.6* 10.4  NEUTROABS 6.8  --   --   --   --   --   --   HGB 13.4  < > 11.4* 11.0* 10.8* 10.2* 10.7*  HCT 39.4  < > 33.9* 32.2* 32.3* 30.3* 32.2*  MCV 79.0  < > 80.1 79.3 79.6 79.5 79.3  PLT 243  < > 208 180 158 154 165  < > = values in this interval not displayed.  Cardiac Enzymes:  Recent Labs Lab 10/13/17 1045 10/13/17 1831 10/13/17 2219 10/14/17 0203  TROPONINI 0.11* 0.09* 0.08* 0.08*    BNP: BNP (last 3 results)  Recent Labs  10/13/17 1045  BNP 3,149.4*    ProBNP (last 3 results) No results for input(s): PROBNP in the last 8760  hours.    Other results:  Imaging: No results found.   Medications:     Scheduled Medications: . aspirin EC  81 mg Oral Daily  . calcitRIOL  0.5 mcg Oral Daily  . Chlorhexidine Gluconate Cloth  6 each Topical Daily  . heparin subcutaneous  5,000 Units Subcutaneous Q8H  . mouth rinse  15 mL Mouth Rinse BID  . polyethylene glycol  17 g Oral Daily  . sodium chloride flush  10-40 mL Intracatheter Q12H  . sodium chloride flush  3 mL Intravenous Q12H  . sodium chloride flush  3 mL Intravenous Q12H  . tamsulosin  0.4 mg Oral QPC supper    Infusions: . sodium chloride    . sodium chloride    . amiodarone 30 mg/hr (10/19/17 2358)  . heparin 10,000 units/ 20 mL infusion syringe 1,850 Units/hr (10/20/17 0609)  . milrinone 0.125 mcg/kg/min (10/19/17 1900)  . dialysis replacement fluid (prismasate) 600 mL/hr at 10/20/17 0415  . dialysis replacement fluid (prismasate) 500 mL/hr at 10/20/17 0009  . dialysate (PRISMASATE) 1,500 mL/hr at 10/20/17 4782  . sodium chloride      PRN Medications: sodium chloride, sodium chloride, acetaminophen, heparin, heparin, labetalol, ondansetron (ZOFRAN) IV, oxymetazoline, sodium chloride, sodium chloride flush, sodium chloride flush, sodium chloride flush, sorbitol   Patient Profile   56 y/o male with h/o severe HTN, systolic HF with EF 25-30%, DM, CKD 4 admitted with decompensated HF and recent ICD shock.   Assessment/Plan   1. Acute on chronic systolic HF due to NICM  10/15/17 ECHO EF 20% Grade III DD and Mod MR - cardiac cath 10/17. No CAD -  Admitted with marked volume overload and Class IV HF symptoms in setting of recent VT/VF with ICD shock on 09/21/16.  - Volume status improved. Stopping CVVHD today. Weight down 26 pounds.  Volume status improving with HD. Urine output picking up 280 cc.  -CO-OX 74% on milrinone 0.125 mcg. Consider stopping milrinone tomorrow.  - not candidate for ACE/ABR/ARNI/spiro with CKD - no b-blocker with acute  decompensation. - given CKD IV only durable long-term option will be consideration of heart-kidney transplantation which we have discussed.   If renal function does not return will need to consider transfer to [facility] for evaluation of possible heart/kidney transplant if he is a candidate. Doubt he will tolerate outpatient HD well with very low EF.   2. VT/VF - multiple recent events with appropriate ICD therapies.  - 5 beat NSVT yesterday Continue amio drip 30 mg per hour.  - Keep K > 4.0 Mg 2.0.  Stable.  - No driving for 6 months by [employer] DMV state law.   3. Elevated troponin - Not ACS. Due to HF. Cath 10/17 no cad - No s/s of ischemia.      4. Acute on chronic renal failure stage 4 - Creatinine baseline 3.0-3.5.  - Stopping CVVHD this am.  - Discussed with Dr Signe Colt at bedside. Should be able to remove foley.  - Says he has seen Nephrology at the [facility] in [place].   5. HTN - Off hydralazine & imdur due to low BP on CVVHD.  6. Severe mitral regurgitation - likely functional MR. - Moderate MR on repeat Echo.   7. Previous CVA - mild R-sided deficits.  -PT/OT to see as he recovers.   8. Epistaxis - Resolved. Afrin PRN   9. Constipation -  Continue miralax +/- sorbitol   Length of Stay: 7  Amy Clegg, NP  10/20/2017, 7:55 AM  Advanced Heart Failure Team Pager [phone] (M-F; 7a - 4p)  Please contact [facility] for night-coverage after hours (4p -7a ) and weekends on [email or web address]   Patient seen and examined with Tonye Becket, NP. We discussed all aspects of the encounter. I agree with the assessment and plan as stated above.   Weight down 26 pounds. Feeling better. We will stop CVVHD today. Co-ox stable after cutting back milrinone. May be able to stop soon. Hopefully renal function will recover. If not, will need to discuss with [facility] about heart/kidney transplant as he will likely not tolerate iHD long if at all.   Arvilla Meres, MD  8:31 AM

## 2017-10-20 NOTE — Progress Notes (Signed)
Patient would like his wife present when completing the Advanced Directive.  Nurse will let Chaplain know when spouse arrives.      10/20/17 1009  Clinical Encounter Type  Visited With Patient;Health care provider  Visit Type Initial     10/20/17 1009  Clinical Encounter Type  Visited With Patient;Health care provider  Visit Type Initial

## 2017-10-20 NOTE — Progress Notes (Signed)
S:56 y.o.male, a veteran With PMHx significant for nonischemic cardiomyopathy, HFrEF, ICD since 2017, HTN and CKD 4, admitted with worsening dyspnea and lower extremity edema, started on CRRT.  CRRT was stopped today.patient was feeling better but appears little depressed, still having shortness of breath with very mild exertion.He denies any chest pain.  O:BP (!) 113/95   Pulse (!) 111   Temp 98.7 F (37.1 C) (Oral)   Resp (!) 22   Ht 5\' 11"  (1.803 m)   Wt 190 lb 14.7 oz (86.6 kg)   SpO2 98%   BMI 26.63 kg/m   Intake/Output Summary (Last 24 hours) at 10/20/17 1254 Last data filed at 10/20/17 1100  Gross per 24 hour  Intake           1514.6 ml  Output             5159 ml  Net          -3644.4 ml   Intake/Output: I/O last 3 completed shifts: In: 1914.8 [P.O.:1070; I.V.:784.8; Other:60] Out: 9520 [Urine:395; ZOXWR:6045; Stool:1]  Intake/Output this shift:  Total I/O In: 320.8 [P.O.:240; I.V.:80.8] Out: 358 [Urine:70; Other:288] Weight change: -12 lb 9.1 oz (-5.7 kg)   Gen: well-developed gentleman, in no acute distress. CVS: tachycardia with regular rate. Resp: decreased breath sounds at bases. Abd: Soft, non tender, bowel sounds positive.  Ext: 1+ lower extremity edema .  Recent Labs Lab 10/16/17 0521  10/17/17 0303 10/17/17 1545 10/18/17 0518 10/18/17 1556 10/19/17 0500 10/19/17 1558 10/20/17 0440  NA 133*  < > 132* 132* 131* 132* 131* 133* 131*  K 4.0  < > 3.8 4.1 4.2 4.1 4.3 4.1 4.6  CL 101  < > 99* 100* 97* 99* 96* 98* 98*  CO2 20*  < > 23 25 25 27 26 25 26   GLUCOSE 105*  < > 101* 120* 113* 124* 142* 146* 90  BUN 58*  < > 38* 24* 17 15 15 12 11   CREATININE 4.58*  < > 3.24* 2.40* 2.01* 1.94* 1.89* 1.72* 1.76*  ALBUMIN 3.0*  < > 2.9* 2.8* 2.7* 2.6* 2.5* 2.6* 2.7*  CALCIUM 8.5*  < > 8.1* 8.1* 8.2* 8.0* 8.1* 8.3* 8.3*  PHOS  --   < > 2.9 2.1* 2.1* 3.4 2.3* 3.9 2.7  AST 18  --   --   --   --   --   --   --   --   ALT 72*  --   --   --   --   --   --   --   --    < > = values in this interval not displayed. Liver Function Tests:  Recent Labs Lab 10/16/17 0521  10/19/17 0500 10/19/17 1558 10/20/17 0440  AST 18  --   --   --   --   ALT 72*  --   --   --   --   ALKPHOS 63  --   --   --   --   BILITOT 1.2  --   --   --   --   PROT 6.6  --   --   --   --   ALBUMIN 3.0*  < > 2.5* 2.6* 2.7*  < > = values in this interval not displayed. No results for input(s): LIPASE, AMYLASE in the last 168 hours. No results for input(s): AMMONIA in the last 168 hours. CBC:  Recent Labs Lab 10/16/17 0521 10/17/17 0303 10/18/17 0518  10/19/17 0426 10/20/17 0440  WBC 8.2 8.6 10.1 10.6* 10.4  HGB 11.4* 11.0* 10.8* 10.2* 10.7*  HCT 33.9* 32.2* 32.3* 30.3* 32.2*  MCV 80.1 79.3 79.6 79.5 79.3  PLT 208 180 158 154 165   Cardiac Enzymes:  Recent Labs Lab 10/13/17 1831 10/13/17 2219 10/14/17 0203  TROPONINI 0.09* 0.08* 0.08*   CBG:  Recent Labs Lab 10/13/17 1530 10/13/17 1946  GLUCAP 99 118*    Iron Studies: No results for input(s): IRON, TIBC, TRANSFERRIN, FERRITIN in the last 72 hours. Studies/Results: No results found. Marland Kitchen. aspirin EC  81 mg Oral Daily  . calcitRIOL  0.5 mcg Oral Daily  . Chlorhexidine Gluconate Cloth  6 each Topical Daily  . heparin subcutaneous  5,000 Units Subcutaneous Q8H  . polyethylene glycol  17 g Oral Daily  . sodium chloride flush  10-40 mL Intracatheter Q12H  . sodium chloride flush  3 mL Intravenous Q12H  . sodium chloride flush  3 mL Intravenous Q12H  . tamsulosin  0.4 mg Oral QPC supper    BMET    Component Value Date/Time   NA 131 (L) 10/20/2017 0440   K 4.6 10/20/2017 0440   CL 98 (L) 10/20/2017 0440   CO2 26 10/20/2017 0440   GLUCOSE 90 10/20/2017 0440   BUN 11 10/20/2017 0440   CREATININE 1.76 (H) 10/20/2017 0440   CALCIUM 8.3 (L) 10/20/2017 0440   GFRNONAA 41 (L) 10/20/2017 0440   GFRAA 47 (L) 10/20/2017 0440   CBC    Component Value Date/Time   WBC 10.4 10/20/2017 0440   RBC 4.06 (L)  10/20/2017 0440   HGB 10.7 (L) 10/20/2017 0440   HCT 32.2 (L) 10/20/2017 0440   PLT 165 10/20/2017 0440   MCV 79.3 10/20/2017 0440   MCH 26.4 10/20/2017 0440   MCHC 33.2 10/20/2017 0440   RDW 16.1 (H) 10/20/2017 0440   LYMPHSABS 1.4 10/13/2017 1045   MONOABS 0.6 10/13/2017 1045   EOSABS 0.1 10/13/2017 1045   BASOSABS 0.0 10/13/2017 1045     Assessment/Plan:  AKI with CKD 4. Volume status improved, CRRT was stopped today, approximately pulling of 17L , Wt. is down 26 pounds.renal function back to his baseline. Patient remained oliguric, with some improvement and total urine output of approximately 300 mL. Mild hyponatremia with normal acid base status. -We will keep monitoring his renal function, might need hemodialysis tomorrow afternoon.  Hypophosphatemia. Most likely secondary to CRRT, phosphorous today was 2.7, within displayed improvement with stop of CRRT.  HFrEF. Heart failure team is following. Milrinone dose was decreased to 0.125 MCG today. It might be stopped tomorrow. If his renal function does not improve, [facility] be [facility].  Hypertension. Normotensive today.Hydralazine was discontinued. -Keep monitoring blood pressure, avoid hypotension.  Anemia.Currently hemoglobin stable. Feraheme was given on 10/18/2017. -continue monitoring.  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 -Calcitriol 0.5 per day.  Arrhythmia.Cardiology is monitoring. Patient is currently on amiodarone infusion

## 2017-10-21 LAB — RENAL FUNCTION PANEL
ALBUMIN: 2.7 g/dL — AB (ref 3.5–5.0)
ANION GAP: 8 (ref 5–15)
Albumin: 2.6 g/dL — ABNORMAL LOW (ref 3.5–5.0)
Anion gap: 11 (ref 5–15)
BUN: 23 mg/dL — ABNORMAL HIGH (ref 6–20)
BUN: 11 mg/dL (ref 6–20)
CALCIUM: 8.7 mg/dL — AB (ref 8.9–10.3)
CHLORIDE: 96 mmol/L — AB (ref 101–111)
CO2: 22 mmol/L (ref 22–32)
CO2: 27 mmol/L (ref 22–32)
CREATININE: 3.17 mg/dL — AB (ref 0.61–1.24)
Calcium: 8.3 mg/dL — ABNORMAL LOW (ref 8.9–10.3)
Chloride: 95 mmol/L — ABNORMAL LOW (ref 101–111)
Creatinine, Ser: 2.1 mg/dL — ABNORMAL HIGH (ref 0.61–1.24)
GFR calc Af Amer: 23 mL/min — ABNORMAL LOW (ref >60)
GFR calc Af Amer: 38 mL/min — ABNORMAL LOW (ref >60)
GFR calc non Af Amer: 33 mL/min — ABNORMAL LOW (ref >60)
GFR, EST NON AFRICAN AMERICAN: 20 mL/min — AB (ref >60)
GLUCOSE: 101 mg/dL — AB (ref 65–99)
Glucose, Bld: 83 mg/dL (ref 65–99)
PHOSPHORUS: 3.2 mg/dL (ref 2.5–4.6)
POTASSIUM: 3.5 mmol/L (ref 3.5–5.1)
Phosphorus: 4.6 mg/dL (ref 2.5–4.6)
Potassium: 4.2 mmol/L (ref 3.5–5.1)
SODIUM: 129 mmol/L — AB (ref 135–145)
SODIUM: 130 mmol/L — AB (ref 135–145)

## 2017-10-21 LAB — MAGNESIUM: MAGNESIUM: 2.2 mg/dL (ref 1.7–2.4)

## 2017-10-21 LAB — CBC
HCT: 33 % — ABNORMAL LOW (ref 39.0–52.0)
HEMATOCRIT: 31.9 % — AB (ref 39.0–52.0)
HEMOGLOBIN: 10.7 g/dL — AB (ref 13.0–17.0)
Hemoglobin: 11.2 g/dL — ABNORMAL LOW (ref 13.0–17.0)
MCH: 26.4 pg (ref 26.0–34.0)
MCH: 26.6 pg (ref 26.0–34.0)
MCHC: 33.5 g/dL (ref 30.0–36.0)
MCHC: 33.9 g/dL (ref 30.0–36.0)
MCV: 78.6 fL (ref 78.0–100.0)
MCV: 78.4 fL (ref 78.0–100.0)
PLATELETS: 163 10*3/uL (ref 150–400)
Platelets: 175 10*3/uL (ref 150–400)
RBC: 4.06 MIL/uL — ABNORMAL LOW (ref 4.22–5.81)
RBC: 4.21 MIL/uL — ABNORMAL LOW (ref 4.22–5.81)
RDW: 15.6 % — ABNORMAL HIGH (ref 11.5–15.5)
RDW: 15.9 % — ABNORMAL HIGH (ref 11.5–15.5)
WBC: 9.3 10*3/uL (ref 4.0–10.5)
WBC: 7.1 10*3/uL (ref 4.0–10.5)

## 2017-10-21 LAB — COOXEMETRY PANEL
Carboxyhemoglobin: 2.1 % — ABNORMAL HIGH (ref 0.5–1.5)
METHEMOGLOBIN: 0.7 % (ref 0.0–1.5)
O2 Saturation: 75.6 %
Total hemoglobin: 10.6 g/dL — ABNORMAL LOW (ref 12.0–16.0)

## 2017-10-21 LAB — ALT: ALT: 28 U/L (ref 17–63)

## 2017-10-21 LAB — OSMOLALITY: Osmolality: 274 mOsm/kg — ABNORMAL LOW (ref 275–295)

## 2017-10-21 LAB — SODIUM, URINE, RANDOM: SODIUM UR: 81 mmol/L

## 2017-10-21 LAB — OSMOLALITY, URINE: OSMOLALITY UR: 241 mosm/kg — AB (ref 300–900)

## 2017-10-21 MED ORDER — FUROSEMIDE 10 MG/ML IJ SOLN
160.0000 mg | Freq: Two times a day (BID) | INTRAVENOUS | Status: DC
  Administered 2017-10-21 – 2017-10-22 (×3): 160 mg via INTRAVENOUS
  Filled 2017-10-21: qty 16
  Filled 2017-10-21: qty 14
  Filled 2017-10-21: qty 16
  Filled 2017-10-21: qty 10

## 2017-10-21 NOTE — Progress Notes (Addendum)
Advanced Heart Failure Rounding Note   Subjective:    Remains on milrinone 0.125. Co-ox 76% CVVHD stopped on 10/29 Made about 420 cc of urine over past 24 hours. No weight yet today. Creatinine 1.76-> 2.59. CVP 12  Feels weak. No SOB or CP.   RHC 10/16/2017  RA = 20 RV = 53/23 PA = 54/26 (39) PCW = 28 v =35 Fick cardiac output/index = 3.6/1.7 Thermo CO/CI = 7.8/3.6 PVR =1.8 WU SVR = 2022 Ao sat = 99% PA sat = 56%, 56% SVC sat 66%  Objective:   Weight Range:  Vital Signs:   Temp:  [98.5 F (36.9 C)-99.3 F (37.4 C)] 98.8 F (37.1 C) (10/30 0300) Pulse Rate:  [93-125] 109 (10/30 0400) Resp:  [12-31] 18 (10/30 0400) BP: (98-133)/(68-106) 128/88 (10/30 0400) SpO2:  [89 %-100 %] 99 % (10/30 0400) Last BM Date: 10/20/17  Weight change: Filed Weights   10/18/17 0500 10/19/17 0420 10/20/17 0413  Weight: 94.1 kg (207 lb 7.3 oz) 92.3 kg (203 lb 7.8 oz) 86.6 kg (190 lb 14.7 oz)    Intake/Output:   Intake/Output Summary (Last 24 hours) at 10/21/17 0531 Last data filed at 10/21/17 0500  Gross per 24 hour  Intake           779.87 ml  Output              950 ml  Net          -170.13 ml     Physical Exam: CVP 12 General:  Fatigued appearing. No resp difficulty on warming blanket HEENT: normal Neck: supple. RIJ trialysis Carotids 2+ bilat; no bruits. No lymphadenopathy or thryomegaly appreciated. Cor: PMI laterally displaced. Regular rate & rhythm. +s3 Lungs: clear Abdomen: soft, nontender, nondistended. No hepatosplenomegaly. No bruits or masses. Good bowel sounds. Extremities: no cyanosis, clubbing, rash, edema Neuro: alert & orientedx3, cranial nerves grossly intact. moves all 4 extremities w/o difficulty. Affect pleasant    Telemetry: Sinus Tach 100-120s Personally reviewed   Labs: Basic Metabolic Panel:  Recent Labs Lab 10/16/17 0521  10/17/17 0303  10/18/17 0518 10/18/17 1556 10/19/17 0426 10/19/17 0500 10/19/17 1558 10/20/17 0440  10/20/17 1759  NA 133*  < > 132*  < > 131* 132*  --  131* 133* 131* 131*  K 4.0  < > 3.8  < > 4.2 4.1  --  4.3 4.1 4.6 4.1  CL 101  < > 99*  < > 97* 99*  --  96* 98* 98* 96*  CO2 20*  < > 23  < > 25 27  --  26 25 26 26   GLUCOSE 105*  < > 101*  < > 113* 124*  --  142* 146* 90 104*  BUN 58*  < > 38*  < > 17 15  --  15 12 11 18   CREATININE 4.58*  < > 3.24*  < > 2.01* 1.94*  --  1.89* 1.72* 1.76* 2.59*  CALCIUM 8.5*  < > 8.1*  < > 8.2* 8.0*  --  8.1* 8.3* 8.3* 8.4*  MG 1.9  --  2.2  --  2.2  --  2.4  --   --  2.3  --   PHOS  --   < > 2.9  < > 2.1* 3.4  --  2.3* 3.9 2.7 3.7  < > = values in this interval not displayed.  Liver Function Tests:  Recent Labs Lab 10/16/17 0521  10/18/17 1556 10/19/17 0500 10/19/17 1558  10/20/17 0440 10/20/17 1759  AST 18  --   --   --   --   --   --   ALT 72*  --   --   --   --   --   --   ALKPHOS 63  --   --   --   --   --   --   BILITOT 1.2  --   --   --   --   --   --   PROT 6.6  --   --   --   --   --   --   ALBUMIN 3.0*  < > 2.6* 2.5* 2.6* 2.7* 2.5*  < > = values in this interval not displayed. No results for input(s): LIPASE, AMYLASE in the last 168 hours. No results for input(s): AMMONIA in the last 168 hours.  CBC:  Recent Labs Lab 10/17/17 0303 10/18/17 0518 10/19/17 0426 10/20/17 0440 10/21/17 0426  WBC 8.6 10.1 10.6* 10.4 9.3  HGB 11.0* 10.8* 10.2* 10.7* 10.7*  HCT 32.2* 32.3* 30.3* 32.2* 31.9*  MCV 79.3 79.6 79.5 79.3 78.6  PLT 180 158 154 165 175    Cardiac Enzymes: No results for input(s): CKTOTAL, CKMB, CKMBINDEX, TROPONINI in the last 168 hours.  BNP: BNP (last 3 results)  Recent Labs  10/13/17 1045  BNP 3,149.4*    ProBNP (last 3 results) No results for input(s): PROBNP in the last 8760 hours.    Other results:  Imaging: No results found.   Medications:     Scheduled Medications: . amiodarone  200 mg Oral BID  . aspirin EC  81 mg Oral Daily  . calcitRIOL  0.5 mcg Oral Daily  . Chlorhexidine  Gluconate Cloth  6 each Topical Daily  . heparin subcutaneous  5,000 Units Subcutaneous Q8H  . polyethylene glycol  17 g Oral Daily  . sodium chloride flush  10-40 mL Intracatheter Q12H  . sodium chloride flush  3 mL Intravenous Q12H  . sodium chloride flush  3 mL Intravenous Q12H  . tamsulosin  0.4 mg Oral QPC supper    Infusions: . sodium chloride    . sodium chloride Stopped (10/20/17 0700)  . sodium chloride    . sodium chloride    . milrinone 0.125 mcg/kg/min (10/20/17 1900)    PRN Medications: sodium chloride, sodium chloride, sodium chloride, sodium chloride, acetaminophen, alteplase, heparin, heparin, heparin, labetalol, lidocaine (PF), lidocaine-prilocaine, ondansetron (ZOFRAN) IV, oxymetazoline, pentafluoroprop-tetrafluoroeth, sodium chloride flush, sodium chloride flush, sodium chloride flush, sorbitol   Patient Profile   56 y/o male with h/o severe HTN, systolic HF with EF 25-30%, DM, CKD 4 admitted with decompensated HF and recent ICD shock.   Assessment/Plan   1. Acute on chronic systolic HF due to NICM  10/15/17 ECHO EF 20% Grade III DD and Mod MR - cardiac cath 10/17. No CAD - Admitted with marked volume overload and Class IV HF symptoms in setting of recent VT/VF with ICD shock on 09/21/16. - Course c/b by cardiorenal syndrome and A/C CKD requiring CVVHD - CVHHD stopped 10/29 Weight down 26 pounds.  - Urine output picking up slowly.  -CO-OX 76% on milrinone 0.125 mcg. Will stop today - not candidate for ACE/ABR/ARNI/spiro with CKD - no b-blocker with acute decompensation. - given CKD IV only durable long-term option will be consideration of heart-kidney transplantation which we have discussed.  - If renal function does not return will need to consider initiating iHD and see how  he tolerates followed by eventual  [facility] possible heart/kidney transplant if he is a candidate. Doubt he will tolerate outpatient HD long with  very low EF.   - Will start back IV lasix 160 IV bid-> renal can change as needed.  2. VT/VF - Occasional events of NSVT while in house   - now on po amio. Will stop milrinone today - Keep K > 4.0 Mg 2.0.  Stable.  - No driving for 6 months by [place] DMV state law.   3. Elevated troponin - Not ACS. Due to HF. Cath 10/17 no cad - No s/s of ischemia.      4. Acute on chronic renal failure stage 4 - Creatinine baseline 3.0-3.5.  - Stopped CVVHD 10/29 - Discussed with Dr Signe ColtUpton at bedside.  - Says [facility] [place].  - Will start back IV lasix 160 IV bid. Renal can change as needed - See above for discussion  5. HTN - Off hydralazine & imdur due to low BP on CVVHD.  6. Severe mitral regurgitation - likely functional MR. - Moderate MR on repeat Echo.   7. Previous CVA - mild R-sided deficits.  -PT/OT to see as he recovers.     Length of Stay: 8  Arvilla MeresBensimhon, Daniel, MD  10/21/2017, 5:31 AM  Advanced Heart Failure Team Pager [phone] (M-F; 7a - 4p)  Please contact [facility] for night-coverage after hours (4p -7a ) and weekends on [email or web address]

## 2017-10-21 NOTE — Plan of Care (Signed)
Problem: Activity: Goal: Risk for activity intolerance will decrease Outcome: Progressing Pt has been getting up into the chair, but today declined.

## 2017-10-21 NOTE — Progress Notes (Signed)
S: 56 y.o.male, a veteran With PMHx significant for nonischemic cardiomyopathy, HFrEF, ICD since 2017, HTN and CKD 4, admitted with worsening dyspnea and lower extremity edema, started on CRRT.  Patient was feeling little better,creatinine little worse as compared to yesterday, CRRT was stopped yesterday morning. Had total urine output of 130 mL over the past 24 hour.  O:BP 98/72   Pulse 92   Temp 98.3 F (36.8 C)   Resp 16   Ht 5\' 11"  (1.803 m)   Wt 190 lb 11.2 oz (86.5 kg)   SpO2 99%   BMI 26.60 kg/m   Intake/Output Summary (Last 24 hours) at 10/21/17 1301 Last data filed at 10/21/17 1100  Gross per 24 hour  Intake           328.06 ml  Output              830 ml  Net          -501.94 ml   Intake/Output: I/O last 3 completed shifts: In: 1285.7 [P.O.:840; I.V.:445.7] Out: 3528 [Urine:330; WUJWJ:1914Other:3197; Stool:1]  Intake/Output this shift:  Total I/O In: 186 [P.O.:120; IV Piggyback:66] Out: 770 [Urine:770] Weight change: -3.5 oz (-0.1 kg)  Gen: well-developed gentleman,period is little depressed, in no acute distress. CVS: tachycardia with regular rate. Resp: decreased breath sounds at bases. Abd: Soft, non tender, bowel sounds positive.  Ext: 2+ lower extremity edema .  Recent Labs Lab 10/16/17 0521  10/18/17 0518 10/18/17 1556 10/19/17 0500 10/19/17 1558 10/20/17 0440 10/20/17 1759 10/21/17 0630  NA 133*  < > 131* 132* 131* 133* 131* 131* 129*  K 4.0  < > 4.2 4.1 4.3 4.1 4.6 4.1 4.2  CL 101  < > 97* 99* 96* 98* 98* 96* 96*  CO2 20*  < > 25 27 26 25 26 26 22   GLUCOSE 105*  < > 113* 124* 142* 146* 90 104* 83  BUN 58*  < > 17 15 15 12 11 18  23*  CREATININE 4.58*  < > 2.01* 1.94* 1.89* 1.72* 1.76* 2.59* 3.17*  ALBUMIN 3.0*  < > 2.7* 2.6* 2.5* 2.6* 2.7* 2.5* 2.6*  CALCIUM 8.5*  < > 8.2* 8.0* 8.1* 8.3* 8.3* 8.4* 8.7*  PHOS  --   < > 2.1* 3.4 2.3* 3.9 2.7 3.7 4.6  AST 18  --   --   --   --   --   --   --   --   ALT 72*  --   --   --   --   --   --   --   --   < >  = values in this interval not displayed. Liver Function Tests:  Recent Labs Lab 10/16/17 0521  10/20/17 0440 10/20/17 1759 10/21/17 0630  AST 18  --   --   --   --   ALT 72*  --   --   --   --   ALKPHOS 63  --   --   --   --   BILITOT 1.2  --   --   --   --   PROT 6.6  --   --   --   --   ALBUMIN 3.0*  < > 2.7* 2.5* 2.6*  < > = values in this interval not displayed. No results for input(s): LIPASE, AMYLASE in the last 168 hours. No results for input(s): AMMONIA in the last 168 hours. CBC:  Recent Labs Lab 10/17/17 0303 10/18/17 0518 10/19/17  1610 10/20/17 0440 10/21/17 0426  WBC 8.6 10.1 10.6* 10.4 9.3  HGB 11.0* 10.8* 10.2* 10.7* 10.7*  HCT 32.2* 32.3* 30.3* 32.2* 31.9*  MCV 79.3 79.6 79.5 79.3 78.6  PLT 180 158 154 165 175   Cardiac Enzymes: No results for input(s): CKTOTAL, CKMB, CKMBINDEX, TROPONINI in the last 168 hours. CBG: No results for input(s): GLUCAP in the last 168 hours.  Iron Studies: No results for input(s): IRON, TIBC, TRANSFERRIN, FERRITIN in the last 72 hours. Studies/Results: No results found. Marland Kitchen amiodarone  200 mg Oral BID  . aspirin EC  81 mg Oral Daily  . calcitRIOL  0.5 mcg Oral Daily  . Chlorhexidine Gluconate Cloth  6 each Topical Daily  . heparin subcutaneous  5,000 Units Subcutaneous Q8H  . polyethylene glycol  17 g Oral Daily  . sodium chloride flush  10-40 mL Intracatheter Q12H  . sodium chloride flush  3 mL Intravenous Q12H  . sodium chloride flush  3 mL Intravenous Q12H  . tamsulosin  0.4 mg Oral QPC supper    BMET    Component Value Date/Time   NA 129 (L) 10/21/2017 0630   K 4.2 10/21/2017 0630   CL 96 (L) 10/21/2017 0630   CO2 22 10/21/2017 0630   GLUCOSE 83 10/21/2017 0630   BUN 23 (H) 10/21/2017 0630   CREATININE 3.17 (H) 10/21/2017 0630   CALCIUM 8.7 (L) 10/21/2017 0630   GFRNONAA 20 (L) 10/21/2017 0630   GFRAA 23 (L) 10/21/2017 0630   CBC    Component Value Date/Time   WBC 9.3 10/21/2017 0426   RBC 4.06 (L)  10/21/2017 0426   HGB 10.7 (L) 10/21/2017 0426   HCT 31.9 (L) 10/21/2017 0426   PLT 175 10/21/2017 0426   MCV 78.6 10/21/2017 0426   MCH 26.4 10/21/2017 0426   MCHC 33.5 10/21/2017 0426   RDW 15.6 (H) 10/21/2017 0426   LYMPHSABS 1.4 10/13/2017 1045   MONOABS 0.6 10/13/2017 1045   EOSABS 0.1 10/13/2017 1045   BASOSABS 0.0 10/13/2017 1045     Assessment/Plan:  AKI with CKD 4. He appears more volume overloaded as compared to yesterday,has very little urine output over the past 24 hour. Heart failure team start him on Lasix 160 mg IV twice a day, his urine output started improving he has made more than 700 mL since this morning. -monitor renal function and diuresed if needed.  Hypophosphatemia. Most likely secondary to CRRT, resolved today.  HFrEF. Heart failure team is following. Milrinone continue at 0.125 MCG, appears more volume loaded as compared to yesterday, Lasix 160 mg twice a day was started.  Hypertension. Blood pressure little soft. -Keep monitoring and avoid hypotension.  Anemia.Currently hemoglobin stable. Feraheme was given on 10/18/2017. -continue monitoring.  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 -Calcitriol 0.5 per day.  Arrhythmia.Cardiology is monitoring. Patient is currently on amiodarone 200 mg BID.  Edgar [name]

## 2017-10-22 LAB — RENAL FUNCTION PANEL
ALBUMIN: 2.7 g/dL — AB (ref 3.5–5.0)
ANION GAP: 11 (ref 5–15)
Albumin: 2.6 g/dL — ABNORMAL LOW (ref 3.5–5.0)
Anion gap: 11 (ref 5–15)
BUN: 17 mg/dL (ref 6–20)
BUN: 27 mg/dL — ABNORMAL HIGH (ref 6–20)
CALCIUM: 8.6 mg/dL — AB (ref 8.9–10.3)
CHLORIDE: 95 mmol/L — AB (ref 101–111)
CO2: 25 mmol/L (ref 22–32)
CO2: 22 mmol/L (ref 22–32)
Calcium: 8.5 mg/dL — ABNORMAL LOW (ref 8.9–10.3)
Chloride: 95 mmol/L — ABNORMAL LOW (ref 101–111)
Creatinine, Ser: 2.72 mg/dL — ABNORMAL HIGH (ref 0.61–1.24)
Creatinine, Ser: 3.31 mg/dL — ABNORMAL HIGH (ref 0.61–1.24)
GFR, EST AFRICAN AMERICAN: 28 mL/min — AB (ref >60)
GFR, EST AFRICAN AMERICAN: 22 mL/min — AB (ref >60)
GFR, EST NON AFRICAN AMERICAN: 24 mL/min — AB (ref >60)
GFR, EST NON AFRICAN AMERICAN: 19 mL/min — AB (ref >60)
GLUCOSE: 106 mg/dL — AB (ref 65–99)
Glucose, Bld: 81 mg/dL (ref 65–99)
POTASSIUM: 3.8 mmol/L (ref 3.5–5.1)
POTASSIUM: 4.2 mmol/L (ref 3.5–5.1)
Phosphorus: 4.3 mg/dL (ref 2.5–4.6)
Phosphorus: 4.4 mg/dL (ref 2.5–4.6)
SODIUM: 128 mmol/L — AB (ref 135–145)
Sodium: 131 mmol/L — ABNORMAL LOW (ref 135–145)

## 2017-10-22 LAB — HEPATITIS B SURFACE ANTIBODY,QUALITATIVE: HEP B S AB: NONREACTIVE

## 2017-10-22 LAB — HEPATITIS B CORE ANTIBODY, TOTAL: Hep B Core Total Ab: NEGATIVE

## 2017-10-22 LAB — CBC
HCT: 35.3 % — ABNORMAL LOW (ref 39.0–52.0)
HEMOGLOBIN: 12 g/dL — AB (ref 13.0–17.0)
MCH: 26.6 pg (ref 26.0–34.0)
MCHC: 34 g/dL (ref 30.0–36.0)
MCV: 78.3 fL (ref 78.0–100.0)
Platelets: 168 10*3/uL (ref 150–400)
RBC: 4.51 MIL/uL (ref 4.22–5.81)
RDW: 15.6 % — ABNORMAL HIGH (ref 11.5–15.5)
WBC: 9 10*3/uL (ref 4.0–10.5)

## 2017-10-22 LAB — HEPATITIS B SURFACE ANTIGEN: HEP B S AG: NEGATIVE

## 2017-10-22 LAB — MAGNESIUM: MAGNESIUM: 2.1 mg/dL (ref 1.7–2.4)

## 2017-10-22 MED ORDER — FUROSEMIDE 10 MG/ML IJ SOLN
160.0000 mg | Freq: Three times a day (TID) | INTRAVENOUS | Status: DC
  Administered 2017-10-22 (×2): 160 mg via INTRAVENOUS
  Filled 2017-10-22 (×4): qty 16

## 2017-10-22 MED ORDER — POTASSIUM CHLORIDE 20 MEQ/15ML (10%) PO SOLN: 20.0000 meq | Freq: Once | ORAL | Status: DC

## 2017-10-22 MED ORDER — POTASSIUM CHLORIDE CRYS ER 20 MEQ PO TBCR
20.0000 meq | EXTENDED_RELEASE_TABLET | Freq: Once | ORAL | Status: AC
  Administered 2017-10-22: 20 meq via ORAL
  Filled 2017-10-22: qty 1

## 2017-10-22 NOTE — Progress Notes (Signed)
10/22/2017 0930 IV therapy at bedside to assess extra port on HD cath, per night shift RN unable to draw back blood and collect ordered Co-ox. IV therapy able to obtain small amount of blood but not enough to run Co-ox. IV therapy states line appears to be in proper position and flushes well, still safe to use. Will continue to closely monitor patient. Gabryela Kimbrell, Blanchard KelchStephanie Ingold

## 2017-10-22 NOTE — Evaluation (Signed)
Physical Therapy Evaluation Patient Details Name: Edgar [name] MRN: [id number] DOB: 06/25/1961 Today's Date: 10/22/2017   History of Present Illness  56 y/o male with h/o severe HTN, systolic HF with EF 25-30%, DM, CKD 4 admitted with decompensated HF and recent ICD shock. Recovery complicated by cardiorenal syndrome; considering heart and kidney transplants  Clinical Impression  Pt admitted with above diagnosis. Pt currently with functional limitations due to the deficits listed below (see PT Problem List). Very fatigued with minimal activity; will place OT order for energy conservation;  Pt will benefit from skilled PT to increase their independence and safety with mobility to allow discharge to the venue listed below.       Follow Up Recommendations Home health PT    Equipment Recommendations  Rolling walker with 5" wheels;3in1 (PT)    Recommendations for Other Services OT consult (Energy conservation)     Precautions / Restrictions Precautions Precautions: Other (comment) Precaution Comments: Decr functional capacity      Mobility  Bed Mobility Overal bed mobility: Needs Assistance Bed Mobility: Supine to Sit     Supine to sit: Min assist     General bed mobility comments: Min assist for support; smooth movement to EOB; close guard for lines  Transfers Overall transfer level: Needs assistance Equipment used: 2 person hand held assist Transfers: Sit to/from Stand Sit to Stand: Min assist         General transfer comment: Min assist to steady; cues to self-monitor for activity tolerance  Ambulation/Gait Ambulation/Gait assistance: Min assist Ambulation Distance (Feet):  (pivot steps bed to chair) Assistive device: 2 person hand held assist       General Gait Details: Cues to self-monitor for activity tolerance  Stairs            Wheelchair Mobility    Modified Rankin (Stroke Patients Only)       Balance Overall balance assessment: Needs  assistance   Sitting balance-Leahy Scale: Good       Standing balance-Leahy Scale: Fair                               Pertinent Vitals/Pain Pain Assessment: No/denies pain    Home Living Family/patient expects to be discharged to:: Private residence Living Arrangements: Other (Comment) (ex-wife) Available Help at Discharge: Available PRN/intermittently Type of Home: House Home Access: Stairs to enter   Entrance Stairs-Number of Steps: 3 Home Layout: One level Home Equipment: None      Prior Function Level of Independence: Independent               Hand Dominance        Extremity/Trunk Assessment   Upper Extremity Assessment Upper Extremity Assessment: Overall WFL for tasks assessed    Lower Extremity Assessment Lower Extremity Assessment: Generalized weakness       Communication   Communication: No difficulties  Cognition Arousal/Alertness: Awake/alert Behavior During Therapy: WFL for tasks assessed/performed Overall Cognitive Status: Within Functional Limits for tasks assessed                                        General Comments General comments (skin integrity, edema, etc.): Pt became very fatigued with minimal activity; RR incr to 33 with simple OOB to chair transfer; reported dizziness at EOB, BP 120/90; HR 112 at rest post transfer to chair  Exercises     Assessment/Plan    PT Assessment Patient needs continued PT services  PT Problem List Decreased activity tolerance;Decreased balance;Decreased mobility;Decreased knowledge of use of DME;Decreased knowledge of precautions;Cardiopulmonary status limiting activity       PT Treatment Interventions DME instruction;Gait training;Functional mobility training;Therapeutic activities;Stair training;Therapeutic exercise;Patient/family education    PT Goals (Current goals can be found in the Care Plan section)  Acute Rehab PT Goals Patient Stated Goal: did not state;  a lot on his mind PT Goal Formulation: With patient Time For Goal Achievement: 11/05/17 Potential to Achieve Goals: Good    Frequency Min 3X/week   Barriers to discharge Decreased caregiver support      Co-evaluation               AM-PAC PT "6 Clicks" Daily Activity  Outcome Measure Difficulty turning over in bed (including adjusting bedclothes, sheets and blankets)?: A Little Difficulty moving from lying on back to sitting on the side of the bed? : A Little Difficulty sitting down on and standing up from a chair with arms (e.g., wheelchair, bedside commode, etc,.)?: A Little Help needed moving to and from a bed to chair (including a wheelchair)?: A Little Help needed walking in hospital room?: A Little Help needed climbing 3-5 steps with a railing? : A Lot 6 Click Score: 17    End of Session   Activity Tolerance: Patient limited by fatigue Patient left: in chair;with call bell/phone within reach;with chair alarm set Nurse Communication: Mobility status PT Visit Diagnosis: Other abnormalities of gait and mobility (R26.89);Other (comment) (Decr Functional Capacity)    Time: 1610-9604 PT Time Calculation (min) (ACUTE ONLY): 15 min   Charges:   PT Evaluation $PT Eval Moderate Complexity: 1 Mod     PT G Codes:        Van Clines, PT  Acute Rehabilitation Services Pager [phone] Office [phone]   Edgar [name] 10/22/2017, 3:50 PM

## 2017-10-22 NOTE — Progress Notes (Addendum)
Advanced Heart Failure Rounding Note   Subjective:    Yesterday milrinone was stopped. Unable to check CO-OX. Had iHD yesterday. Overnight he missed lasix dose. Urine output dropped.   Complaining of fatigue. Denies SOB.   RHC 10/16/2017  RA = 20 RV = 53/23 PA = 54/26 (39) PCW = 28 v =35 Fick cardiac output/index = 3.6/1.7 Thermo CO/CI = 7.8/3.6 PVR =1.8 WU SVR = 2022 Ao sat = 99% PA sat = 56%, 56% SVC sat 66%  Objective:   Weight Range:  Vital Signs:   Temp:  [98 F (36.7 C)-98.7 F (37.1 C)] 98.7 F (37.1 C) (10/31 0800) Pulse Rate:  [91-119] 105 (10/31 0800) Resp:  [12-37] 27 (10/31 0800) BP: (98-130)/(69-106) 124/93 (10/31 0800) SpO2:  [89 %-100 %] 100 % (10/31 0800) Weight:  [181 lb 7 oz (82.3 kg)-192 lb 3.9 oz (87.2 kg)] 192 lb 3.9 oz (87.2 kg) (10/31 0414) Last BM Date: 10/20/17  Weight change: Filed Weights   10/21/17 1300 10/21/17 1650 10/22/17 0414  Weight: 186 lb 15.2 oz (84.8 kg) 181 lb 7 oz (82.3 kg) 192 lb 3.9 oz (87.2 kg)    Intake/Output:   Intake/Output Summary (Last 24 hours) at 10/22/17 0833 Last data filed at 10/22/17 0735  Gross per 24 hour  Intake              306 ml  Output             3595 ml  Net            -3289 ml     Physical Exam: CVP 22 General:  Appear chronically ill. No resp difficulty HEENT: normal  Neck: supple. JVP to jaw.  Carotids 2+ bilat; no bruits. No lymphadenopathy or thryomegaly appreciated. RIJ trialysis.  Cor: PMI nondisplaced. Regular rate & rhythm. No rubs, or murmurs. + S3  Lungs: clear Abdomen: soft, nontender, nondistended. No hepatosplenomegaly. No bruits or masses. Good bowel sounds. Extremities: no cyanosis, clubbing, rash, edema Neuro: alert & orientedx3, cranial nerves grossly intact. moves all 4 extremities w/o difficulty. Affect flat   Telemetry: Sinus Tach 100s. Personally reviewed.   Labs: Basic Metabolic Panel:  Recent Labs Lab 10/18/17 0518  10/19/17 0426  10/20/17 0440  10/20/17 1759 10/21/17 0630 10/21/17 1600 10/22/17 0210  NA 131*  < >  --   < > 131* 131* 129* 130* 131*  K 4.2  < >  --   < > 4.6 4.1 4.2 3.5 3.8  CL 97*  < >  --   < > 98* 96* 96* 95* 95*  CO2 25  < >  --   < > 26 26 22 27 25   GLUCOSE 113*  < >  --   < > 90 104* 83 101* 81  BUN 17  < >  --   < > 11 18 23* 11 17  CREATININE 2.01*  < >  --   < > 1.76* 2.59* 3.17* 2.10* 2.72*  CALCIUM 8.2*  < >  --   < > 8.3* 8.4* 8.7* 8.3* 8.5*  MG 2.2  --  2.4  --  2.3  --  2.2  --  2.1  PHOS 2.1*  < >  --   < > 2.7 3.7 4.6 3.2 4.3  < > = values in this interval not displayed.  Liver Function Tests:  Recent Labs Lab 10/16/17 0521  10/20/17 0440 10/20/17 1759 10/21/17 0630 10/21/17 1415 10/21/17 1600 10/22/17 0210  AST 18  --   --   --   --   --   --   --   ALT 72*  --   --   --   --  28  --   --   ALKPHOS 63  --   --   --   --   --   --   --   BILITOT 1.2  --   --   --   --   --   --   --   PROT 6.6  --   --   --   --   --   --   --   ALBUMIN 3.0*  < > 2.7* 2.5* 2.6*  --  2.7* 2.6*  < > = values in this interval not displayed. No results for input(s): LIPASE, AMYLASE in the last 168 hours. No results for input(s): AMMONIA in the last 168 hours.  CBC:  Recent Labs Lab 10/19/17 0426 10/20/17 0440 10/21/17 0426 10/21/17 1416 10/22/17 0210  WBC 10.6* 10.4 9.3 7.1 9.0  HGB 10.2* 10.7* 10.7* 11.2* 12.0*  HCT 30.3* 32.2* 31.9* 33.0* 35.3*  MCV 79.5 79.3 78.6 78.4 78.3  PLT 154 165 175 163 168    Cardiac Enzymes: No results for input(s): CKTOTAL, CKMB, CKMBINDEX, TROPONINI in the last 168 hours.  BNP: BNP (last 3 results)  Recent Labs  10/13/17 1045  BNP 3,149.4*    ProBNP (last 3 results) No results for input(s): PROBNP in the last 8760 hours.    Other results:  Imaging: No results found.   Medications:     Scheduled Medications: . amiodarone  200 mg Oral BID  . aspirin EC  81 mg Oral Daily  . calcitRIOL  0.5 mcg Oral Daily  . Chlorhexidine Gluconate Cloth   6 each Topical Daily  . heparin subcutaneous  5,000 Units Subcutaneous Q8H  . polyethylene glycol  17 g Oral Daily  . sodium chloride flush  10-40 mL Intracatheter Q12H  . sodium chloride flush  3 mL Intravenous Q12H  . sodium chloride flush  3 mL Intravenous Q12H  . tamsulosin  0.4 mg Oral QPC supper    Infusions: . sodium chloride    . sodium chloride Stopped (10/20/17 0700)  . sodium chloride    . sodium chloride    . furosemide Stopped (10/22/17 0835)    PRN Medications: sodium chloride, sodium chloride, sodium chloride, sodium chloride, acetaminophen, alteplase, heparin, heparin, heparin, labetalol, lidocaine (PF), lidocaine-prilocaine, ondansetron (ZOFRAN) IV, pentafluoroprop-tetrafluoroeth, sodium chloride flush, sodium chloride flush, sodium chloride flush, sorbitol   Patient Profile   56 y/o male with h/o severe HTN, systolic HF with EF 25-30%, DM, CKD 4 admitted with decompensated HF and recent ICD shock.   Assessment/Plan   1. Acute on chronic systolic HF due to NICM  10/15/17 ECHO EF 20% Grade III DD and Mod MR - cardiac cath 10/17. No CAD - Admitted with marked volume overload and Class IV HF symptoms in setting of recent VT/VF with ICD shock on 09/21/16. - Course c/b by cardiorenal syndrome and A/C CKD requiring CVVHD - CVHHD stopped 10/29.  - ihd 10/30  Continue 160 mg IV lasix twice a day. (missed a dose last night)  - not candidate for ACE/ABR/ARNI/spiro with CKD - no b-blocker with acute decompensation. - given CKD IV only durable long-term option will be consideration of heart-kidney transplantation which we have discussed.  - If renal function does not return will need  to consider initiating iHD and see how he tolerates followed by [facility]  [facility]kidney transplant if he is a candidate. Doubt he will tolerate outpatient HD long with  very low EF.   2. VT/VF - Occasional events of NSVT while in house   - now on  po amio.  - Keep K > 4.0 Mg 2.0. K 3.8.  Give 20 meq Kx1 .  - No driving for 6 months by [place] DMV state law.   3. Elevated troponin - Not ACS. Due to HF. Cath 10/17 no cad - No s/s of ischemia.      4. Acute on chronic renal failure stage 4 - Creatinine baseline 3.0-3.5.  - Stopped CVVHD 10/29 - Tolerated iHD 10/30  - Plan to assess daily - [facility] [place].  - - See above for discussion  5. HTN - Stable. Off HTN meds. iHD   6. Severe mitral regurgitation - likely functional MR. - Moderate MR on repeat Echo.   7. Previous CVA - mild R-sided deficits.  -Consult PT/OT  OOB today. Add IS.     Length of Stay: 9  Amy Clegg, NP  10/22/2017, 8:33 AM  Advanced Heart Failure Team Pager [phone] (M-F; 7a - 4p)  Please contact [facility] for night-coverage after hours (4p -7a ) and weekends on [email or web address]  Patient seen and examined with Tonye BecketAmy Clegg, NP. We discussed all aspects of the encounter. I agree with the assessment and plan as stated above.   Remains very tenuous. Urine output picking up but still remains inadequate. Tolerated iHD yesterday. Suspect he may need on ongoing basis to manage volume. Hydralazine stopped to make room for HD. Will likely need access placed. Will d/w Nephrology. NSVT quieting down. Doubt renal function will recover enough.  Will discuss with [facility] about starting heart/kidney transplant eval as he will likely not tolerate iHD long if at all. Check blood type.   Arvilla MeresBensimhon, Daniel, MD  11:32 AM

## 2017-10-22 NOTE — Progress Notes (Signed)
S:56 y.o.male, a veteran With PMHx significant for nonischemic cardiomyopathy, HFrEF, ICD since 2017, HTN and CKD 4, admitted with worsening dyspnea and lower extremity edema, had CRRT for 3-4 days, HD yesterday.  Patient was feeling better and seen this morning. He was little sleepy, stating that he was unable to get a good night's sleep last night.  He had his dialysis with removal of 2500 mL of fluid yesterday. He started making good amount of urine with Lasix, his urine output decreased overnight as he missed one dose yesterday evening.  O:BP (!) 118/95   Pulse (!) 103   Temp 97.9 F (36.6 C) (Oral)   Resp 12   Ht 5\' 11"  (1.803 m)   Wt 192 lb 3.9 oz (87.2 kg)   SpO2 100%   BMI 26.81 kg/m   Intake/Output Summary (Last 24 hours) at 10/22/17 1320 Last data filed at 10/22/17 1100  Gross per 24 hour  Intake              546 ml  Output             3600 ml  Net            -3054 ml   Intake/Output: I/O last 3 completed shifts: In: 464.8 [P.O.:360; I.V.:38.8; IV Piggyback:66] Out: 3905 [Urine:1405; Other:2500]  Intake/Output this shift:  Total I/O In: 306 [P.O.:240; IV Piggyback:66] Out: 525 [Urine:525] Weight change: -3 lb 12 oz (-1.7 kg)  Gen: well-developed gentleman,appears little tired and sleepy, in no acute distress. CVS: tachycardia with regular rate. Resp: clear bilaterally. Abd: Soft, non tender, bowel sounds positive.  Ext: Trace  lower extremity edema .   Recent Labs Lab 10/16/17 0521  10/19/17 0500 10/19/17 1558 10/20/17 0440 10/20/17 1759 10/21/17 0630 10/21/17 1415 10/21/17 1600 10/22/17 0210  NA 133*  < > 131* 133* 131* 131* 129*  --  130* 131*  K 4.0  < > 4.3 4.1 4.6 4.1 4.2  --  3.5 3.8  CL 101  < > 96* 98* 98* 96* 96*  --  95* 95*  CO2 20*  < > 26 25 26 26 22   --  27 25  GLUCOSE 105*  < > 142* 146* 90 104* 83  --  101* 81  BUN 58*  < > 15 12 11 18  23*  --  11 17  CREATININE 4.58*  < > 1.89* 1.72* 1.76* 2.59* 3.17*  --  2.10* 2.72*  ALBUMIN  3.0*  < > 2.5* 2.6* 2.7* 2.5* 2.6*  --  2.7* 2.6*  CALCIUM 8.5*  < > 8.1* 8.3* 8.3* 8.4* 8.7*  --  8.3* 8.5*  PHOS  --   < > 2.3* 3.9 2.7 3.7 4.6  --  3.2 4.3  AST 18  --   --   --   --   --   --   --   --   --   ALT 72*  --   --   --   --   --   --  28  --   --   < > = values in this interval not displayed. Liver Function Tests:  Recent Labs Lab 10/16/17 0521  10/21/17 0630 10/21/17 1415 10/21/17 1600 10/22/17 0210  AST 18  --   --   --   --   --   ALT 72*  --   --  28  --   --   ALKPHOS 63  --   --   --   --   --  BILITOT 1.2  --   --   --   --   --   PROT 6.6  --   --   --   --   --   ALBUMIN 3.0*  < > 2.6*  --  2.7* 2.6*  < > = values in this interval not displayed. No results for input(s): LIPASE, AMYLASE in the last 168 hours. No results for input(s): AMMONIA in the last 168 hours. CBC:  Recent Labs Lab 10/19/17 0426 10/20/17 0440 10/21/17 0426 10/21/17 1416 10/22/17 0210  WBC 10.6* 10.4 9.3 7.1 9.0  HGB 10.2* 10.7* 10.7* 11.2* 12.0*  HCT 30.3* 32.2* 31.9* 33.0* 35.3*  MCV 79.5 79.3 78.6 78.4 78.3  PLT 154 165 175 163 168   Cardiac Enzymes: No results for input(s): CKTOTAL, CKMB, CKMBINDEX, TROPONINI in the last 168 hours. CBG: No results for input(s): GLUCAP in the last 168 hours.  Iron Studies: No results for input(s): IRON, TIBC, TRANSFERRIN, FERRITIN in the last 72 hours. Studies/Results: No results found. Marland Kitchen amiodarone  200 mg Oral BID  . aspirin EC  81 mg Oral Daily  . calcitRIOL  0.5 mcg Oral Daily  . Chlorhexidine Gluconate Cloth  6 each Topical Daily  . heparin subcutaneous  5,000 Units Subcutaneous Q8H  . polyethylene glycol  17 g Oral Daily  . sodium chloride flush  10-40 mL Intracatheter Q12H  . sodium chloride flush  3 mL Intravenous Q12H  . sodium chloride flush  3 mL Intravenous Q12H  . tamsulosin  0.4 mg Oral QPC supper    BMET    Component Value Date/Time   NA 131 (L) 10/22/2017 0210   K 3.8 10/22/2017 0210   CL 95 (L) 10/22/2017  0210   CO2 25 10/22/2017 0210   GLUCOSE 81 10/22/2017 0210   BUN 17 10/22/2017 0210   CREATININE 2.72 (H) 10/22/2017 0210   CALCIUM 8.5 (L) 10/22/2017 0210   GFRNONAA 24 (L) 10/22/2017 0210   GFRAA 28 (L) 10/22/2017 0210   CBC    Component Value Date/Time   WBC 9.0 10/22/2017 0210   RBC 4.51 10/22/2017 0210   HGB 12.0 (L) 10/22/2017 0210   HCT 35.3 (L) 10/22/2017 0210   PLT 168 10/22/2017 0210   MCV 78.3 10/22/2017 0210   MCH 26.6 10/22/2017 0210   MCHC 34.0 10/22/2017 0210   RDW 15.6 (H) 10/22/2017 0210   LYMPHSABS 1.4 10/13/2017 1045   MONOABS 0.6 10/13/2017 1045   EOSABS 0.1 10/13/2017 1045   BASOSABS 0.0 10/13/2017 1045     Assessment/Plan:  AKI with CKD 4. Patient appears euvolemic today, started picking up some urine output after getting morning dose of Lasix. Overnight decreased urinary output after missing a dose of Lasix, had his dialysis yesterday. -We will keep monitoring his renal function and urinary output and decide about dialysis on day-to-day basis. -continue diuresis with Lasix 160 mg twice daily.  Hypophosphatemia. Resolved today. Phosphorous of 4.3.  HFrEF. Heart failure team is following. Milrinone Was discontinued yesterday. -continue diuresis, with strict intake and output records.  Hypertension. Normotensive today.  -Keep monitoring and avoid hypotension.  Anemia.Currently hemoglobin stable. Feraheme was given on 10/18/2017. -continue monitoring.  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 -Calcitriol 0.5 per day.  Arrhythmia.Cardiology is monitoring. Patient is currently on amiodarone 200 mg BID.  Arnetha Courser

## 2017-10-23 LAB — RENAL FUNCTION PANEL
Albumin: 2.9 g/dL — ABNORMAL LOW (ref 3.5–5.0)
Anion gap: 14 (ref 5–15)
BUN: 39 mg/dL — ABNORMAL HIGH (ref 6–20)
CALCIUM: 9 mg/dL (ref 8.9–10.3)
CO2: 25 mmol/L (ref 22–32)
CREATININE: 3.81 mg/dL — AB (ref 0.61–1.24)
Chloride: 92 mmol/L — ABNORMAL LOW (ref 101–111)
GFR calc non Af Amer: 16 mL/min — ABNORMAL LOW (ref >60)
GFR, EST AFRICAN AMERICAN: 19 mL/min — AB (ref >60)
GLUCOSE: 96 mg/dL (ref 65–99)
Phosphorus: 4.9 mg/dL — ABNORMAL HIGH (ref 2.5–4.6)
Potassium: 3.6 mmol/L (ref 3.5–5.1)
SODIUM: 131 mmol/L — AB (ref 135–145)

## 2017-10-23 LAB — ABO/RH: ABO/RH(D): O POS

## 2017-10-23 LAB — MAGNESIUM: MAGNESIUM: 1.9 mg/dL (ref 1.7–2.4)

## 2017-10-23 LAB — COOXEMETRY PANEL
CARBOXYHEMOGLOBIN: 1.2 % (ref 0.5–1.5)
METHEMOGLOBIN: 0.8 % (ref 0.0–1.5)
O2 SAT: 60.4 %
TOTAL HEMOGLOBIN: 13.6 g/dL (ref 12.0–16.0)

## 2017-10-23 LAB — CBC
HEMATOCRIT: 37.9 % — AB (ref 39.0–52.0)
HEMOGLOBIN: 13.2 g/dL (ref 13.0–17.0)
MCH: 27.3 pg (ref 26.0–34.0)
MCHC: 34.8 g/dL (ref 30.0–36.0)
MCV: 78.3 fL (ref 78.0–100.0)
Platelets: 208 10*3/uL (ref 150–400)
RBC: 4.84 MIL/uL (ref 4.22–5.81)
RDW: 15.9 % — ABNORMAL HIGH (ref 11.5–15.5)
WBC: 8.8 10*3/uL (ref 4.0–10.5)

## 2017-10-23 LAB — TYPE AND SCREEN
ABO/RH(D): O POS
Antibody Screen: NEGATIVE

## 2017-10-23 MED ORDER — FUROSEMIDE 80 MG PO TABS: 160.0000 mg | ORAL_TABLET | Freq: Two times a day (BID) | ORAL | Status: DC

## 2017-10-23 MED ORDER — FUROSEMIDE 80 MG PO TABS
160.0000 mg | ORAL_TABLET | Freq: Two times a day (BID) | ORAL | Status: DC
  Administered 2017-10-23 – 2017-10-25 (×5): 160 mg via ORAL
  Filled 2017-10-23 (×5): qty 2

## 2017-10-23 NOTE — Progress Notes (Signed)
OT Cancellation Note  Patient Details Name: Erby PianChristopher Sandhu MRN: [id number] DOB: 12/28/1960   Cancelled Treatment:    Reason Eval/Treat Not Completed: Pt in process of transferring to 6E.  Will reattempt.  Helder Crisafulli Medononarpe, OTR/L [phone]    Jeani HawkingConarpe, Donnamaria Shands M 10/23/2017, 2:16 PM

## 2017-10-23 NOTE — Progress Notes (Signed)
Patient's alert and oriented, states feels weak when getting up to use BSC.  Respiratory came into the room and asked if he used a CPAP at home, patient stated sometimes.  Respiratory asked if he needed one here, patient agreed.  RR were ranging from mid 20s to 30s, after CPAP, RR below 20s.

## 2017-10-23 NOTE — Progress Notes (Signed)
Placed patient on CPAP for the night via auto-mode with minimum pressure set at 5cm and maximum pressure set at 20cm  

## 2017-10-23 NOTE — Progress Notes (Signed)
S: Good urine output with Lasix 160 IV TID.  Last HD Monday.  O:BP 95/63   Pulse 92   Temp 98.7 F (37.1 C) (Oral)   Resp (!) 9   Ht 5\' 11"  (1.803 m)   Wt 83.7 kg (184 lb 8.4 oz)   SpO2 97%   BMI 25.74 kg/m   Intake/Output Summary (Last 24 hours) at 10/23/17 1221 Last data filed at 10/23/17 1000  Gross per 24 hour  Intake              912 ml  Output             2425 ml  Net            -1513 ml   Intake/Output: I/O last 3 completed shifts: In: 1168 [P.O.:960; I.V.:10; IV Piggyback:198] Out: 2625 [Urine:2625]  Intake/Output this shift:  Total I/O In: 290 [P.O.:260; I.V.:30] Out: 450 [Urine:450] Weight change: -1.1 kg (-2 lb 6.8 oz)  Gen: well-developed gentleman, sitting in bed NECK: R IJ nontunneled HD cath in place CVS: tachycardia with regular rate. Resp: clear bilaterally. Abd: Soft, non tender, bowel sounds positive.  Ext: Trace  lower extremity edema .   Recent Labs Lab 10/19/17 1558 10/20/17 0440 10/20/17 1759 10/21/17 0630 10/21/17 1415 10/21/17 1600 10/22/17 0210 10/22/17 1502  NA 133* 131* 131* 129*  --  130* 131* 128*  K 4.1 4.6 4.1 4.2  --  3.5 3.8 4.2  CL 98* 98* 96* 96*  --  95* 95* 95*  CO2 25 26 26 22   --  27 25 22   GLUCOSE 146* 90 104* 83  --  101* 81 106*  BUN 12 11 18  23*  --  11 17 27*  CREATININE 1.72* 1.76* 2.59* 3.17*  --  2.10* 2.72* 3.31*  ALBUMIN 2.6* 2.7* 2.5* 2.6*  --  2.7* 2.6* 2.7*  CALCIUM 8.3* 8.3* 8.4* 8.7*  --  8.3* 8.5* 8.6*  PHOS 3.9 2.7 3.7 4.6  --  3.2 4.3 4.4  ALT  --   --   --   --  28  --   --   --    Liver Function Tests:  Recent Labs Lab 10/21/17 1415 10/21/17 1600 10/22/17 0210 10/22/17 1502  ALT 28  --   --   --   ALBUMIN  --  2.7* 2.6* 2.7*   No results for input(s): LIPASE, AMYLASE in the last 168 hours. No results for input(s): AMMONIA in the last 168 hours. CBC:  Recent Labs Lab 10/19/17 0426 10/20/17 0440 10/21/17 0426 10/21/17 1416 10/22/17 0210  WBC 10.6* 10.4 9.3 7.1 9.0  HGB 10.2*  10.7* 10.7* 11.2* 12.0*  HCT 30.3* 32.2* 31.9* 33.0* 35.3*  MCV 79.5 79.3 78.6 78.4 78.3  PLT 154 165 175 163 168   Cardiac Enzymes: No results for input(s): CKTOTAL, CKMB, CKMBINDEX, TROPONINI in the last 168 hours. CBG: No results for input(s): GLUCAP in the last 168 hours.  Iron Studies: No results for input(s): IRON, TIBC, TRANSFERRIN, FERRITIN in the last 72 hours. Studies/Results: No results found. Marland Kitchen. amiodarone  200 mg Oral BID  . aspirin EC  81 mg Oral Daily  . calcitRIOL  0.5 mcg Oral Daily  . Chlorhexidine Gluconate Cloth  6 each Topical Daily  . furosemide  160 mg Oral BID  . heparin subcutaneous  5,000 Units Subcutaneous Q8H  . polyethylene glycol  17 g Oral Daily  . sodium chloride flush  10-40 mL Intracatheter Q12H  .  sodium chloride flush  3 mL Intravenous Q12H  . sodium chloride flush  3 mL Intravenous Q12H  . tamsulosin  0.4 mg Oral QPC supper    BMET    Component Value Date/Time   NA 128 (L) 10/22/2017 1502   K 4.2 10/22/2017 1502   CL 95 (L) 10/22/2017 1502   CO2 22 10/22/2017 1502   GLUCOSE 106 (H) 10/22/2017 1502   BUN 27 (H) 10/22/2017 1502   CREATININE 3.31 (H) 10/22/2017 1502   CALCIUM 8.6 (L) 10/22/2017 1502   GFRNONAA 19 (L) 10/22/2017 1502   GFRAA 22 (L) 10/22/2017 1502   CBC    Component Value Date/Time   WBC 9.0 10/22/2017 0210   RBC 4.51 10/22/2017 0210   HGB 12.0 (L) 10/22/2017 0210   HCT 35.3 (L) 10/22/2017 0210   PLT 168 10/22/2017 0210   MCV 78.3 10/22/2017 0210   MCH 26.6 10/22/2017 0210   MCHC 34.0 10/22/2017 0210   RDW 15.6 (H) 10/22/2017 0210   LYMPHSABS 1.4 10/13/2017 1045   MONOABS 0.6 10/13/2017 1045   EOSABS 0.1 10/13/2017 1045   BASOSABS 0.0 10/13/2017 1045     Assessment/Plan:  1.  AKI with CKD 4: was on CRRT and had one IHD Monday.  Good urine output.  Continue high-dose Lasix.  We will continue to make daily assessments for the need for dialysis.  .Vein mapping today.   2. HFrEF: ? Heart/ kidney eval  eventually.    3.  Anemia.Currently hemoglobin stable. Feraheme was given on 10/18/2017. -continue monitoring.  4.  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 -Calcitriol 0.5 per day.  5.  Afib  On amio  Bufford Buttner MD [employer] pgr [phone]

## 2017-10-23 NOTE — Care Management Note (Addendum)
Case Management Note  Patient Details  Name: Edgar [name] MRN: [id number] Date of Birth: 09/30/1961  Subjective/Objective:    From home,   S/p RHC 10/24 after failure of diureses with worsening renal function and dyspena. Lasix increased to tid, now on CRRT.co ox of 80.6 on milrinone.    10/30 1601 [name] RN, BSN -conts on milrinone, CVVHD stopped 10/29,urine picking back up, CVP 12, if renal function does not return consider IHD per MD note, conts on iv lasix, he has very low EF.     11/1 1122 [name] RN,BSN - [facility], HHRN  and rolling walker, He will need orders put in with face to face.  His ex wife will be with him at home when he is discharge, she works  8pm to 8am , but he states he can manage while she is at work.                               Action/Plan: DC home with HHPT  , HHRN  and rolling walker, will need HHPT and DME orders.  Expected Discharge Date:                  Expected Discharge Plan:  Home w Home Health Services  In-House Referral:     Discharge planning Services  CM Consult  Post Acute Care Choice:  Home Health Choice offered to:  Patient  DME Arranged:    DME Agency:     HH Arranged:  PT, RN HH Agency:  [facility]  Status of Service:  Completed, signed off  If discussed at Long Length of Stay Meetings, dates discussed:    Additional Comments:  [name], [name], RN 10/23/2017, 11:25 AM

## 2017-10-23 NOTE — Progress Notes (Signed)
Advanced Heart Failure Rounding Note   Subjective:    Yesterday diuresed 160 IV twice a day. Making over 2 liters of urine. Negative 1.3 liters. Renal function trending up.   Denies SOB. CVP 8. Creatinine 2.7 -> 3.3  RHC 10/16/2017  RA = 20 RV = 53/23 PA = 54/26 (39) PCW = 28 v =35 Fick cardiac output/index = 3.6/1.7 Thermo CO/CI = 7.8/3.6 PVR =1.8 WU SVR = 2022 Ao sat = 99% PA sat = 56%, 56% SVC sat 66%  Objective:   Weight Range:  Vital Signs:   Temp:  [97.9 F (36.6 C)-98.4 F (36.9 C)] 97.9 F (36.6 C) (11/01 0300) Pulse Rate:  [99-117] 99 (11/01 0600) Resp:  [10-42] 36 (11/01 0600) BP: (117-137)/(81-109) 123/94 (11/01 0600) SpO2:  [89 %-100 %] 89 % (11/01 0600) Weight:  [184 lb 8.4 oz (83.7 kg)] 184 lb 8.4 oz (83.7 kg) (11/01 0438) Last BM Date: 10/20/17  Weight change: Filed Weights   10/21/17 1650 10/22/17 0414 10/23/17 0438  Weight: 181 lb 7 oz (82.3 kg) 192 lb 3.9 oz (87.2 kg) 184 lb 8.4 oz (83.7 kg)    Intake/Output:   Intake/Output Summary (Last 24 hours) at 10/23/17 0804 Last data filed at 10/23/17 0600  Gross per 24 hour  Intake             1092 ml  Output             2500 ml  Net            -1408 ml     Physical Exam: CVP 8.   General:  Appears fatigued. No resp difficulty HEENT: normal Neck: supple. JVD 8-9 . Carotids 2+ bilat; no bruits. No lymphadenopathy or thryomegaly appreciated. RIJ trialysis catheter Cor: PMI laterally displaced. Regular rate & rhythm. No rubs, gallops or murmurs. Lungs: clear on room air.  Abdomen: soft, nontender, nondistended. No hepatosplenomegaly. No bruits or masses. Good bowel sounds. Extremities: no cyanosis, clubbing, rash, edema Neuro: alert & orientedx3, cranial nerves grossly intact. moves all 4 extremities w/o difficulty. Affect pleasant   Telemetry: Sinus Tach 100s  Personally reviewed.   Labs: Basic Metabolic Panel:  Recent Labs Lab 10/18/17 0518  10/19/17 0426  10/20/17 0440  10/20/17 1759 10/21/17 0630 10/21/17 1600 10/22/17 0210 10/22/17 1502  NA 131*  < >  --   < > 131* 131* 129* 130* 131* 128*  K 4.2  < >  --   < > 4.6 4.1 4.2 3.5 3.8 4.2  CL 97*  < >  --   < > 98* 96* 96* 95* 95* 95*  CO2 25  < >  --   < > 26 26 22 27 25 22   GLUCOSE 113*  < >  --   < > 90 104* 83 101* 81 106*  BUN 17  < >  --   < > 11 18 23* 11 17 27*  CREATININE 2.01*  < >  --   < > 1.76* 2.59* 3.17* 2.10* 2.72* 3.31*  CALCIUM 8.2*  < >  --   < > 8.3* 8.4* 8.7* 8.3* 8.5* 8.6*  MG 2.2  --  2.4  --  2.3  --  2.2  --  2.1  --   PHOS 2.1*  < >  --   < > 2.7 3.7 4.6 3.2 4.3 4.4  < > = values in this interval not displayed.  Liver Function Tests:  Recent Labs Lab 10/20/17 1759  10/21/17 0630 10/21/17 1415 10/21/17 1600 10/22/17 0210 10/22/17 1502  ALT  --   --  28  --   --   --   ALBUMIN 2.5* 2.6*  --  2.7* 2.6* 2.7*   No results for input(s): LIPASE, AMYLASE in the last 168 hours. No results for input(s): AMMONIA in the last 168 hours.  CBC:  Recent Labs Lab 10/19/17 0426 10/20/17 0440 10/21/17 0426 10/21/17 1416 10/22/17 0210  WBC 10.6* 10.4 9.3 7.1 9.0  HGB 10.2* 10.7* 10.7* 11.2* 12.0*  HCT 30.3* 32.2* 31.9* 33.0* 35.3*  MCV 79.5 79.3 78.6 78.4 78.3  PLT 154 165 175 163 168    Cardiac Enzymes: No results for input(s): CKTOTAL, CKMB, CKMBINDEX, TROPONINI in the last 168 hours.  BNP: BNP (last 3 results)  Recent Labs  10/13/17 1045  BNP 3,149.4*    ProBNP (last 3 results) No results for input(s): PROBNP in the last 8760 hours.    Other results:  Imaging: No results found.   Medications:     Scheduled Medications: . amiodarone  200 mg Oral BID  . aspirin EC  81 mg Oral Daily  . calcitRIOL  0.5 mcg Oral Daily  . Chlorhexidine Gluconate Cloth  6 each Topical Daily  . heparin subcutaneous  5,000 Units Subcutaneous Q8H  . polyethylene glycol  17 g Oral Daily  . sodium chloride flush  10-40 mL Intracatheter Q12H  . sodium chloride flush  3 mL  Intravenous Q12H  . sodium chloride flush  3 mL Intravenous Q12H  . tamsulosin  0.4 mg Oral QPC supper    Infusions: . sodium chloride    . sodium chloride Stopped (10/20/17 0700)  . sodium chloride    . sodium chloride    . furosemide Stopped (10/23/17 0020)    PRN Medications: sodium chloride, sodium chloride, sodium chloride, sodium chloride, acetaminophen, alteplase, heparin, heparin, heparin, labetalol, lidocaine (PF), lidocaine-prilocaine, ondansetron (ZOFRAN) IV, pentafluoroprop-tetrafluoroeth, sodium chloride flush, sodium chloride flush, sodium chloride flush, sorbitol   Patient Profile   56 y/o male with h/o severe HTN, systolic HF with EF 25-30%, DM, CKD 4 admitted with decompensated HF and recent ICD shock.   Assessment/Plan   1. Acute on chronic systolic HF due to NICM  10/15/17 ECHO EF 20% Grade III DD and Mod MR - cardiac cath 10/17. No CAD - Admitted with marked volume overload and Class IV HF symptoms in setting of recent VT/VF with ICD shock on 09/21/16. - Course c/b by cardiorenal syndrome and A/C CKD requiring CVVHD - CVHHD stopped 10/29.  - ihd 10/30  - Volume status improving. Continue 160 mg IV lasix twice daily.  .- not candidate for ACE/ABR/ARNI/spiro with CKD - no b-blocker with acute decompensation. - given CKD IV only durable long-term option will be consideration of heart-kidney transplantation which we have discussed.  - If renal function does not return will need to consider initiating iHD and see how he tolerates followed by [facility]kidney [facility] if he is a candidate. Doubt he will tolerate outpatient HD long with  very low EF.   2. VT/VF - Occasional events of NSVT while in house   - now on po amio.  - Keep K > 4.0 Mg 2.0. K 3.8.  Give 20 meq Kx1 .  - No driving for 6 months by [place] DMV state law.   3. Elevated troponin - Not ACS. Due to HF. Cath 10/17 no cad - No  s/s of ischemia.        4. Acute on chronic renal failure stage 4 - Creatinine baseline 3.0-3.5.  - Stopped CVVHD 10/29 - Tolerated iHD 10/30  - Plan to assess daily - Says he has seen Nephrology at the [facility] in [place].  - Renal function trending up.   5. HTN - Stable. Off HTN meds.   6. Severe mitral regurgitation - likely functional MR. - Moderate MR on repeat Echo.   7. Previous CVA - mild R-sided deficits.  -Consult PT/OT  8. Deconditioning- PT recommending HH. Order placed .   Transfer to SDU.     Length of Stay: 10  Tonye Becket, NP  10/23/2017, 8:04 AM  Advanced Heart Failure Team Pager [phone] (M-F; 7a - 4p)  Please contact [facility] for night-coverage after hours (4p -7a ) and weekends on [email or web address]   Patient seen and examined with Tonye Becket, NP. We discussed all aspects of the encounter. I agree with the assessment and plan as stated above.   Urine output picking up markedly. Creatinine up but close to baseline. No evidence of uremia. Co-ox marginal (60%) off inotropes. Will switch to po lasix and watch urine output and renal function. Will plan vein mapping. Question will be if we can get him out of the hospital without the need for iHD or if we will need to re-initiate dialysis prior to d/c. Will also need to follow co-ox. Plan heart kidney transplant eval soon at [facility]. Discussed with Nephrology at bedside.   Arvilla Meres, MD  8:38 AM

## 2017-10-24 ENCOUNTER — Inpatient Hospital Stay (HOSPITAL_COMMUNITY): Payer: Medicare Other

## 2017-10-24 DIAGNOSIS — N184 Chronic kidney disease, stage 4 (severe): Secondary | ICD-10-CM

## 2017-10-24 LAB — RENAL FUNCTION PANEL
ALBUMIN: 2.8 g/dL — AB (ref 3.5–5.0)
ANION GAP: 12 (ref 5–15)
Albumin: 2.8 g/dL — ABNORMAL LOW (ref 3.5–5.0)
Anion gap: 12 (ref 5–15)
BUN: 46 mg/dL — ABNORMAL HIGH (ref 6–20)
BUN: 48 mg/dL — AB (ref 6–20)
CALCIUM: 8.8 mg/dL — AB (ref 8.9–10.3)
CALCIUM: 8.6 mg/dL — AB (ref 8.9–10.3)
CHLORIDE: 94 mmol/L — AB (ref 101–111)
CO2: 24 mmol/L (ref 22–32)
CO2: 23 mmol/L (ref 22–32)
Chloride: 93 mmol/L — ABNORMAL LOW (ref 101–111)
Creatinine, Ser: 4.23 mg/dL — ABNORMAL HIGH (ref 0.61–1.24)
Creatinine, Ser: 4.24 mg/dL — ABNORMAL HIGH (ref 0.61–1.24)
GFR calc Af Amer: 16 mL/min — ABNORMAL LOW (ref >60)
GFR, EST AFRICAN AMERICAN: 16 mL/min — AB (ref >60)
GFR, EST NON AFRICAN AMERICAN: 14 mL/min — AB (ref >60)
GFR, EST NON AFRICAN AMERICAN: 14 mL/min — AB (ref >60)
GLUCOSE: 82 mg/dL (ref 65–99)
Glucose, Bld: 118 mg/dL — ABNORMAL HIGH (ref 65–99)
PHOSPHORUS: 5.6 mg/dL — AB (ref 2.5–4.6)
POTASSIUM: 3.5 mmol/L (ref 3.5–5.1)
Phosphorus: 4.8 mg/dL — ABNORMAL HIGH (ref 2.5–4.6)
Potassium: 3.3 mmol/L — ABNORMAL LOW (ref 3.5–5.1)
SODIUM: 129 mmol/L — AB (ref 135–145)
SODIUM: 129 mmol/L — AB (ref 135–145)

## 2017-10-24 LAB — CBC
HEMATOCRIT: 36.8 % — AB (ref 39.0–52.0)
Hemoglobin: 12.6 g/dL — ABNORMAL LOW (ref 13.0–17.0)
MCH: 26.8 pg (ref 26.0–34.0)
MCHC: 34.2 g/dL (ref 30.0–36.0)
MCV: 78.3 fL (ref 78.0–100.0)
PLATELETS: 227 10*3/uL (ref 150–400)
RBC: 4.7 MIL/uL (ref 4.22–5.81)
RDW: 16 % — AB (ref 11.5–15.5)
WBC: 9.8 10*3/uL (ref 4.0–10.5)

## 2017-10-24 LAB — COOXEMETRY PANEL
CARBOXYHEMOGLOBIN: 1.2 % (ref 0.5–1.5)
Methemoglobin: 1 % (ref 0.0–1.5)
O2 Saturation: 55.4 %
Total hemoglobin: 13.1 g/dL (ref 12.0–16.0)

## 2017-10-24 LAB — MAGNESIUM: Magnesium: 1.9 mg/dL (ref 1.7–2.4)

## 2017-10-24 MED ORDER — MAGNESIUM SULFATE 2 GM/50ML IV SOLN
2.0000 g | Freq: Once | INTRAVENOUS | Status: AC
  Administered 2017-10-24: 2 g via INTRAVENOUS
  Filled 2017-10-24: qty 50

## 2017-10-24 MED ORDER — POTASSIUM CHLORIDE CRYS ER 20 MEQ PO TBCR
20.0000 meq | EXTENDED_RELEASE_TABLET | Freq: Once | ORAL | Status: AC
  Administered 2017-10-24: 20 meq via ORAL
  Filled 2017-10-24: qty 1

## 2017-10-24 MED ORDER — POTASSIUM CHLORIDE CRYS ER 10 MEQ PO TBCR
10.0000 meq | EXTENDED_RELEASE_TABLET | Freq: Once | ORAL | Status: AC
  Administered 2017-10-24: 10 meq via ORAL
  Filled 2017-10-24: qty 1

## 2017-10-24 NOTE — Progress Notes (Signed)
Physical Therapy Treatment Patient Details Name: Edgar [name] MRN: [id number] DOB: 1961/10/14 Today's Date: 10/24/2017    History of Present Illness 56 y/o male with h/o severe HTN, systolic HF with EF 25-30%, DM, CKD 4 admitted with decompensated HF and recent ICD shock. Recovery complicated by cardiorenal syndrome    PT Comments    Pt making steady progress and tolerated gait training (short distances with sitting rest break for energy conservation). Pt would continue to benefit from skilled physical therapy services at this time while admitted and after d/c to address the below listed limitations in order to improve overall safety and independence with functional mobility.    Follow Up Recommendations  Home health PT     Equipment Recommendations  Rolling walker with 5" wheels;3in1 (PT)    Recommendations for Other Services       Precautions / Restrictions Precautions Precautions: Fall Precaution Comments: poor endurance Restrictions Weight Bearing Restrictions: No    Mobility  Bed Mobility Overal bed mobility: Needs Assistance Bed Mobility: Supine to Sit     Supine to sit: Supervision     General bed mobility comments: increased time and effort, use of bed rail, supervision for safety  Transfers Overall transfer level: Needs assistance Equipment used: Rolling walker (2 wheeled) Transfers: Sit to/from Stand Sit to Stand: Min assist         General transfer comment: increased time and effort, cueing for safe hand placement, assist for stability  Ambulation/Gait Ambulation/Gait assistance: Min guard Ambulation Distance (Feet): 40 Feet (40' x2 with sitting rest break) Assistive device: Rolling walker (2 wheeled) Gait Pattern/deviations: Step-through pattern;Scissoring;Narrow base of support Gait velocity: decreased Gait velocity interpretation: Below normal speed for age/gender General Gait Details: pt fatigues quickly, mildly unsteady but no overt LOB  or need for physical assistance, min guard for safety. Pt ambulated on RA with SPO2 maintaining >94% and HR in the mid 110's throughout   Stairs            Wheelchair Mobility    Modified Rankin (Stroke Patients Only)       Balance Overall balance assessment: Needs assistance Sitting-balance support: Feet supported Sitting balance-Leahy Scale: Good     Standing balance support: During functional activity;Single extremity supported Standing balance-Leahy Scale: Poor                              Cognition Arousal/Alertness: Awake/alert Behavior During Therapy: Flat affect Overall Cognitive Status: Within Functional Limits for tasks assessed                                        Exercises      General Comments        Pertinent Vitals/Pain Pain Assessment: No/denies pain    Home Living                      Prior Function            PT Goals (current goals can now be found in the care plan section) Acute Rehab PT Goals PT Goal Formulation: With patient Time For Goal Achievement: 11/05/17 Potential to Achieve Goals: Good Progress towards PT goals: Progressing toward goals    Frequency    Min 3X/week      PT Plan Current plan remains appropriate    Co-evaluation  AM-PAC PT "6 Clicks" Daily Activity  Outcome Measure  Difficulty turning over in bed (including adjusting bedclothes, sheets and blankets)?: A Little Difficulty moving from lying on back to sitting on the side of the bed? : A Little Difficulty sitting down on and standing up from a chair with arms (e.g., wheelchair, bedside commode, etc,.)?: Unable Help needed moving to and from a bed to chair (including a wheelchair)?: A Little Help needed walking in hospital room?: A Little Help needed climbing 3-5 steps with a railing? : A Little 6 Click Score: 16    End of Session Equipment Utilized During Treatment: Gait belt Activity  Tolerance: Patient limited by fatigue Patient left: in chair;with call bell/phone within reach Nurse Communication: Mobility status PT Visit Diagnosis: Other abnormalities of gait and mobility (R26.89)     Time: [id number] PT Time Calculation (min) (ACUTE ONLY): 20 min  Charges:  $Gait Training: 8-22 mins                    G Codes:       [place] Island[place]ennifer Joseph Bias, [place]PT, [place]PT [phone]    Edgar BevelsJennifer M Edgar [name] 10/24/2017, 4:04 PM

## 2017-10-24 NOTE — Progress Notes (Addendum)
Vein mapping  has been completed.   Farrel DemarkJill Eunice, RDMS, RVT

## 2017-10-24 NOTE — Care Management (Signed)
[facility]  Rolling walker will be delivered to pt.  Pt declined 3in1.

## 2017-10-24 NOTE — Progress Notes (Signed)
Patient's last K level 3.6.  Notify MD via text.

## 2017-10-24 NOTE — Progress Notes (Addendum)
S: IV Lasix --> PO yesterday.  UOP continues to be robust.  Cr still rising.  For vein mapping today  O:BP 107/81 (BP Location: Left Arm)   Pulse (!) 107   Temp 98.1 F (36.7 C) (Oral)   Resp 15   Ht 5\' 11"  (1.803 m)   Wt 82.1 kg (181 lb 1.6 oz)   SpO2 94%   BMI 25.26 kg/m   Intake/Output Summary (Last 24 hours) at 10/24/17 0943 Last data filed at 10/24/17 0500  Gross per 24 hour  Intake              480 ml  Output             1350 ml  Net             -870 ml   Intake/Output: I/O last 3 completed shifts: In: 1016 [P.O.:740; I.V.:210; IV Piggyback:66] Out: 3300 [Urine:3300]  Intake/Output this shift:  No intake/output data recorded. Weight change: -1.554 kg (-3 lb 6.8 oz)  Gen: well-developed gentleman, sitting in bed NECK: R IJ nontunneled HD cath in place CVS: tachycardia with regular rate. Resp: clear bilaterally. Abd: Soft, non tender, bowel sounds positive.  Ext: Trace  lower extremity edema .   Recent Labs Lab 10/20/17 1759 10/21/17 0630 10/21/17 1415 10/21/17 1600 10/22/17 0210 10/22/17 1502 10/23/17 1330 10/24/17 0437  NA 131* 129*  --  130* 131* 128* 131* 129*  K 4.1 4.2  --  3.5 3.8 4.2 3.6 3.5  CL 96* 96*  --  95* 95* 95* 92* 93*  CO2 26 22  --  27 25 22 25 24   GLUCOSE 104* 83  --  101* 81 106* 96 82  BUN 18 23*  --  11 17 27* 39* 46*  CREATININE 2.59* 3.17*  --  2.10* 2.72* 3.31* 3.81* 4.23*  ALBUMIN 2.5* 2.6*  --  2.7* 2.6* 2.7* 2.9* 2.8*  CALCIUM 8.4* 8.7*  --  8.3* 8.5* 8.6* 9.0 8.8*  PHOS 3.7 4.6  --  3.2 4.3 4.4 4.9* 5.6*  ALT  --   --  28  --   --   --   --   --    Liver Function Tests:  Recent Labs Lab 10/21/17 1415  10/22/17 1502 10/23/17 1330 10/24/17 0437  ALT 28  --   --   --   --   ALBUMIN  --   < > 2.7* 2.9* 2.8*  < > = values in this interval not displayed. No results for input(s): LIPASE, AMYLASE in the last 168 hours. No results for input(s): AMMONIA in the last 168 hours. CBC:  Recent Labs Lab 10/21/17 0426  10/21/17 1416 10/22/17 0210 10/23/17 1330 10/24/17 0437  WBC 9.3 7.1 9.0 8.8 9.8  HGB 10.7* 11.2* 12.0* 13.2 12.6*  HCT 31.9* 33.0* 35.3* 37.9* 36.8*  MCV 78.6 78.4 78.3 78.3 78.3  PLT 175 163 168 208 227   Cardiac Enzymes: No results for input(s): CKTOTAL, CKMB, CKMBINDEX, TROPONINI in the last 168 hours. CBG: No results for input(s): GLUCAP in the last 168 hours.  Iron Studies: No results for input(s): IRON, TIBC, TRANSFERRIN, FERRITIN in the last 72 hours. Studies/Results: No results found. Marland Kitchen. amiodarone  200 mg Oral BID  . aspirin EC  81 mg Oral Daily  . calcitRIOL  0.5 mcg Oral Daily  . Chlorhexidine Gluconate Cloth  6 each Topical Daily  . furosemide  160 mg Oral BID  . heparin subcutaneous  5,000 Units  Subcutaneous Q8H  . polyethylene glycol  17 g Oral Daily  . sodium chloride flush  10-40 mL Intracatheter Q12H  . sodium chloride flush  3 mL Intravenous Q12H  . sodium chloride flush  3 mL Intravenous Q12H  . tamsulosin  0.4 mg Oral QPC supper    BMET    Component Value Date/Time   NA 129 (L) 10/24/2017 0437   K 3.5 10/24/2017 0437   CL 93 (L) 10/24/2017 0437   CO2 24 10/24/2017 0437   GLUCOSE 82 10/24/2017 0437   BUN 46 (H) 10/24/2017 0437   CREATININE 4.23 (H) 10/24/2017 0437   CALCIUM 8.8 (L) 10/24/2017 0437   GFRNONAA 14 (L) 10/24/2017 0437   GFRAA 16 (L) 10/24/2017 0437   CBC    Component Value Date/Time   WBC 9.8 10/24/2017 0437   RBC 4.70 10/24/2017 0437   HGB 12.6 (L) 10/24/2017 0437   HCT 36.8 (L) 10/24/2017 0437   PLT 227 10/24/2017 0437   MCV 78.3 10/24/2017 0437   MCH 26.8 10/24/2017 0437   MCHC 34.2 10/24/2017 0437   RDW 16.0 (H) 10/24/2017 0437   LYMPHSABS 1.4 10/13/2017 1045   MONOABS 0.6 10/13/2017 1045   EOSABS 0.1 10/13/2017 1045   BASOSABS 0.0 10/13/2017 1045     Assessment/Plan:  1.  AKI with CKD 4: was on CRRT and had one IHD Monday.  Good urine output.  Continue high-dose Lasix PO now.  We will continue to make daily  assessments for the need for dialysis.  .Vein mapping pending.  No indication for HD today.   2. HFrEF: ? Heart/ kidney eval eventually.    3.  Anemia.Currently hemoglobin stable. Feraheme was given on 10/18/2017. -continue monitoring.  4.  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 -Calcitriol 0.5 per day.  5.  Afib  On amio  6.  Hyponatremia: volume related  Bufford Buttner MD [facility] pgr [phone]

## 2017-10-24 NOTE — Evaluation (Signed)
Occupational Therapy Evaluation Patient Details Name: Edgar PianChristopher [name] MRN: [id number] DOB: 09/25/1961 Today's Date: 10/24/2017    History of Present Illness 56 y/o male with h/o severe HTN, systolic HF with EF 25-30%, DM, CKD 4 admitted with decompensated HF and recent ICD shock. Recovery complicated by cardiorenal syndrome   Clinical Impression   Pt is typically independent. Demonstrates poor endurance, dizziness upon initially sitting and decreased standing balance. He requires min assist for OOB mobility and ADL. Pt educated in energy conservation strategies and handout given. Educated in benefits of seated showering. Pt polite, but seeming distracted. Will follow acutely.    Follow Up Recommendations  Home health OT    Equipment Recommendations   (pt refusing 3 in 1 or shower seat)    Recommendations for Other Services       Precautions / Restrictions Precautions Precautions: Fall Precaution Comments: poor endurance Restrictions Weight Bearing Restrictions: No      Mobility Bed Mobility Overal bed mobility: Modified Independent             General bed mobility comments: HOB up, reliant on rail, no assist  Transfers Overall transfer level: Needs assistance Equipment used: 1 person hand held assist Transfers: Sit to/from Stand Sit to Stand: Min assist         General transfer comment: steadying assist, cues to check for dizziness, pacing    Balance Overall balance assessment: Needs assistance   Sitting balance-Leahy Scale: Good       Standing balance-Leahy Scale: Poor Standing balance comment: pt seeking at least one hand or leaning on a surface                           ADL either performed or assessed with clinical judgement   ADL Overall ADL's : Needs assistance/impaired Eating/Feeding: Independent;Sitting   Grooming: Set up;Sitting;Wash/dry hands;Min guard Grooming Details (indicate cue type and reason): pt tolerated one activity  at sink Upper Body Bathing: Set up;Sitting   Lower Body Bathing: Minimal assistance;Sit to/from stand Lower Body Bathing Details (indicate cue type and reason): recommended long handled bath sponge Upper Body Dressing : Set up;Sitting   Lower Body Dressing: Minimal assistance;Sit to/from stand Lower Body Dressing Details (indicate cue type and reason): unable to access feet, began education in availability of AE, pt declined Toilet Transfer: Minimal assistance;Ambulation Toilet Transfer Details (indicate cue type and reason): stood Toileting- ArchitectClothing Manipulation and Hygiene: Min guard       Functional mobility during ADLs: Minimal assistance (hand held assist) General ADL Comments: Pt educated in energy conservation strategies, reinforced with handout.     Vision Patient Visual Report: No change from baseline       Perception     Praxis      Pertinent Vitals/Pain Pain Assessment: No/denies pain     Hand Dominance Right   Extremity/Trunk Assessment Upper Extremity Assessment Upper Extremity Assessment: Overall WFL for tasks assessed   Lower Extremity Assessment Lower Extremity Assessment: Defer to PT evaluation   Cervical / Trunk Assessment Cervical / Trunk Assessment: Normal   Communication Communication Communication: No difficulties   Cognition Arousal/Alertness: Awake/alert Behavior During Therapy: Flat affect Overall Cognitive Status: Within Functional Limits for tasks assessed                                     General Comments       Exercises  Shoulder Instructions      Home Living Family/patient expects to be discharged to:: Private residence Living Arrangements: Other (Comment) (ex wife) Available Help at Discharge: Available PRN/intermittently Type of Home: House Home Access: Stairs to enter Entrance Stairs-Number of Steps: 3   Home Layout: One level         Bathroom Toilet: Standard     Home Equipment: None           Prior Functioning/Environment Level of Independence: Independent                 OT Problem List: Decreased activity tolerance;Impaired balance (sitting and/or standing);Decreased knowledge of use of DME or AE      OT Treatment/Interventions: Self-care/ADL training;Manual therapy;Patient/family education;Balance training;Therapeutic activities;Energy conservation    OT Goals(Current goals can be found in the care plan section) Acute Rehab OT Goals Patient Stated Goal: to be able to care for himself OT Goal Formulation: With patient Time For Goal Achievement: 11/07/17 Potential to Achieve Goals: Good ADL Goals Pt Will Perform Grooming: with modified independence;standing Pt Will Perform Lower Body Bathing: with modified independence;sit to/from stand;with adaptive equipment Pt Will Perform Lower Body Dressing: with modified independence;with adaptive equipment;sit to/from stand Pt Will Transfer to Toilet: with modified independence;ambulating;regular height toilet Pt Will Perform Toileting - Clothing Manipulation and hygiene: with modified independence;sit to/from stand Additional ADL Goal #1: Pt will employ energy conservation strategies in ADL and mobility with minimal verbal cues.  OT Frequency: Min 2X/week   Barriers to D/C:            Co-evaluation              AM-PAC PT "6 Clicks" Daily Activity     Outcome Measure Help from another person eating meals?: None Help from another person taking care of personal grooming?: A Little Help from another person toileting, which includes using toliet, bedpan, or urinal?: A Little Help from another person bathing (including washing, rinsing, drying)?: A Little Help from another person to put on and taking off regular upper body clothing?: None Help from another person to put on and taking off regular lower body clothing?: A Little 6 Click Score: 20   End of Session Equipment Utilized During Treatment: Gait  belt  Activity Tolerance: Patient limited by fatigue Patient left: in bed;with call bell/phone within reach;with bed alarm set  OT Visit Diagnosis: Unsteadiness on feet (R26.81)                Time: 1610-9604 OT Time Calculation (min): 21 min Charges:  OT General Charges $OT Visit: 1 Visit OT Evaluation $OT Eval Moderate Complexity: 1 Mod G-Codes:     Evern Bio 10/24/2017, 11:35 AM  10/24/2017 Martie Round, OTR/L Pager: [phone]

## 2017-10-24 NOTE — Progress Notes (Signed)
Advanced Heart Failure Rounding Note   Subjective:    Continues to diurese with po Lasix. 1L out overnight. Creatinine continues to rise. 3.81-> 4.23. For vein mapping today. CVP 12. Co ox 55% off inotropes. Denies SOB, orthopnea. Has not gotten up today.    RHC 10/16/2017  RA = 20 RV = 53/23 PA = 54/26 (39) PCW = 28 v =35 Fick cardiac output/index = 3.6/1.7 Thermo CO/CI = 7.8/3.6 PVR =1.8 WU SVR = 2022 Ao sat = 99% PA sat = 56%, 56% SVC sat 66%  Objective:     Vital Signs:   Temp:  [97.4 F (36.3 C)-98.4 F (36.9 C)] 98.1 F (36.7 C) (11/02 0749) Pulse Rate:  [77-110] 107 (11/02 0749) Resp:  [10-33] 15 (11/02 0507) BP: (104-129)/(81-94) 107/81 (11/02 0749) SpO2:  [94 %-100 %] 94 % (11/02 0749) Weight:  [181 lb 1.6 oz (82.1 kg)] 181 lb 1.6 oz (82.1 kg) (11/02 0507) Last BM Date: 10/22/17  Weight change: Filed Weights   10/22/17 0414 10/23/17 0438 10/24/17 0507  Weight: 192 lb 3.9 oz (87.2 kg) 184 lb 8.4 oz (83.7 kg) 181 lb 1.6 oz (82.1 kg)    Intake/Output:   Intake/Output Summary (Last 24 hours) at 10/24/17 1011 Last data filed at 10/24/17 0500  Gross per 24 hour  Intake              410 ml  Output             1250 ml  Net             -840 ml     Physical Exam: CVP 12 General: Well appearing. No resp difficulty. HEENT: Normal Neck: Supple. JVP 10 cm. Carotids 2+ bilat; no bruits. No thyromegaly or nodule noted. RIJ trialysis catheter in place.  Cor: PMI nondisplaced. RRR, No M/G/R noted Lungs: CTAB, normal effort. Abdomen: Soft, non-tender, non-distended, no HSM. No bruits or masses. +BS  Extremities: No cyanosis, clubbing, or rash. R and LLE no edema.  Neuro: Alert & orientedx3, cranial nerves grossly intact. moves all 4 extremities w/o difficulty. Affect pleasant  Telemetry: Sinus tach 100's - personally reviewed.   Labs: Basic Metabolic Panel:  Recent Labs Lab 10/20/17 0440  10/21/17 0630 10/21/17 1600 10/22/17 0210 10/22/17 1502  10/23/17 1330 10/24/17 0437  NA 131*  < > 129* 130* 131* 128* 131* 129*  K 4.6  < > 4.2 3.5 3.8 4.2 3.6 3.5  CL 98*  < > 96* 95* 95* 95* 92* 93*  CO2 26  < > 22 27 25 22 25 24   GLUCOSE 90  < > 83 101* 81 106* 96 82  BUN 11  < > 23* 11 17 27* 39* 46*  CREATININE 1.76*  < > 3.17* 2.10* 2.72* 3.31* 3.81* 4.23*  CALCIUM 8.3*  < > 8.7* 8.3* 8.5* 8.6* 9.0 8.8*  MG 2.3  --  2.2  --  2.1  --  1.9 1.9  PHOS 2.7  < > 4.6 3.2 4.3 4.4 4.9* 5.6*  < > = values in this interval not displayed.  Liver Function Tests:  Recent Labs Lab 10/21/17 1415 10/21/17 1600 10/22/17 0210 10/22/17 1502 10/23/17 1330 10/24/17 0437  ALT 28  --   --   --   --   --   ALBUMIN  --  2.7* 2.6* 2.7* 2.9* 2.8*   No results for input(s): LIPASE, AMYLASE in the last 168 hours. No results for input(s): AMMONIA in the last 168  hours.  CBC:  Recent Labs Lab 10/21/17 0426 10/21/17 1416 10/22/17 0210 10/23/17 1330 10/24/17 0437  WBC 9.3 7.1 9.0 8.8 9.8  HGB 10.7* 11.2* 12.0* 13.2 12.6*  HCT 31.9* 33.0* 35.3* 37.9* 36.8*  MCV 78.6 78.4 78.3 78.3 78.3  PLT 175 163 168 208 227    Cardiac Enzymes: No results for input(s): CKTOTAL, CKMB, CKMBINDEX, TROPONINI in the last 168 hours.  BNP: BNP (last 3 results)  Recent Labs  10/13/17 1045  BNP 3,149.4*    ProBNP (last 3 results) No results for input(s): PROBNP in the last 8760 hours.    Other results:  Imaging: No results found.   Medications:     Scheduled Medications: . amiodarone  200 mg Oral BID  . aspirin EC  81 mg Oral Daily  . calcitRIOL  0.5 mcg Oral Daily  . Chlorhexidine Gluconate Cloth  6 each Topical Daily  . furosemide  160 mg Oral BID  . heparin subcutaneous  5,000 Units Subcutaneous Q8H  . polyethylene glycol  17 g Oral Daily  . sodium chloride flush  10-40 mL Intracatheter Q12H  . sodium chloride flush  3 mL Intravenous Q12H  . sodium chloride flush  3 mL Intravenous Q12H  . tamsulosin  0.4 mg Oral QPC supper     Infusions: . sodium chloride    . sodium chloride Stopped (10/20/17 0700)  . sodium chloride 100 mL (10/23/17 0700)  . sodium chloride      PRN Medications: sodium chloride, sodium chloride, sodium chloride, sodium chloride, acetaminophen, alteplase, heparin, heparin, heparin, labetalol, lidocaine (PF), lidocaine-prilocaine, ondansetron (ZOFRAN) IV, pentafluoroprop-tetrafluoroeth, sodium chloride flush, sodium chloride flush, sodium chloride flush, sorbitol   Patient Profile   56 y/o male with h/o severe HTN, systolic HF with EF 25-30%, DM, CKD 4 admitted with decompensated HF and recent ICD shock.   Assessment/Plan   1. Acute on chronic systolic HF: NICM, cath 09/2016 no CAD. Echo 10/15/17 EF 20% grade II DD, moderate MR. Admitted with volume overload, VT/VF with ICD shock. Required CVVHD and inotropes.  - Volume status improving, CVP 12. Creatinine continues to rise. Continue po Lasix 160 mg BID. Will likely need HD, Dr. Signe ColtUpton following. Plan for vein mapping today.  - No beta blocker with marginal co ox.  - No ARB/ACE/ARNI or Cleda DaubSpiro with renal disease.  - [facility]   2. VT/VF - Continue Amio po.  - Mg 1.9. Today. K 3.5 (after 10mEq KCl). Will give 20mEq Kcl now.   3. Elevated troponin - Due to HF, not ACS.  - Denies chest pain.   4. Acute on chronic renal failure stage 4 - Followed by Dr. Signe ColtUpton.  - Baseline 3.0-3.5.  - Tolerated iHD 10/21/17 - Plan to assess need daily.   5. HTN - Stable.    6. Severe mitral regurgitation - likely functional MR. - Moderate MR on repeat Echo.  - No change.   7. Previous CVA - mild R-sided deficits.  - Continue PT/OT.   8. Deconditioning- PT recommending HH. Order placed .     Length of Stay: 11  Little IshikawaErin E Smith, NP  10/24/2017, 10:11 AM  Advanced Heart Failure Team Pager [phone] (M-F; 7a - 4p)  Please contact [facility] for night-coverage after hours (4p -7a ) and weekends on  [email or web address]   Patient seen and examined with Suzzette RighterErin Smith, NP. We discussed all aspects of the encounter. I agree with the assessment and plan as stated above.  He is worse today. CVP climbing. Renal function worse and co-ox down. Will continue high-dose IV lasix. Will likely need HD tomorrow. If co-ox dropping will need to restart inotropes and consider transfer to [facility] for heart/kindeny transplant eval.   Arvilla Meres, MD  2:02 PM

## 2017-10-25 LAB — CBC
HCT: 35.6 % — ABNORMAL LOW (ref 39.0–52.0)
HEMOGLOBIN: 12.3 g/dL — AB (ref 13.0–17.0)
MCH: 27 pg (ref 26.0–34.0)
MCHC: 34.6 g/dL (ref 30.0–36.0)
MCV: 78.2 fL (ref 78.0–100.0)
PLATELETS: 212 10*3/uL (ref 150–400)
RBC: 4.55 MIL/uL (ref 4.22–5.81)
RDW: 16 % — ABNORMAL HIGH (ref 11.5–15.5)
WBC: 8.4 10*3/uL (ref 4.0–10.5)

## 2017-10-25 LAB — RENAL FUNCTION PANEL
ALBUMIN: 2.9 g/dL — AB (ref 3.5–5.0)
ANION GAP: 12 (ref 5–15)
Albumin: 2.8 g/dL — ABNORMAL LOW (ref 3.5–5.0)
Anion gap: 13 (ref 5–15)
BUN: 49 mg/dL — AB (ref 6–20)
BUN: 52 mg/dL — AB (ref 6–20)
CALCIUM: 8.8 mg/dL — AB (ref 8.9–10.3)
CHLORIDE: 94 mmol/L — AB (ref 101–111)
CHLORIDE: 96 mmol/L — AB (ref 101–111)
CO2: 24 mmol/L (ref 22–32)
CO2: 23 mmol/L (ref 22–32)
CREATININE: 4.34 mg/dL — AB (ref 0.61–1.24)
Calcium: 8.7 mg/dL — ABNORMAL LOW (ref 8.9–10.3)
Creatinine, Ser: 4.54 mg/dL — ABNORMAL HIGH (ref 0.61–1.24)
GFR calc Af Amer: 15 mL/min — ABNORMAL LOW (ref >60)
GFR calc non Af Amer: 14 mL/min — ABNORMAL LOW (ref >60)
GFR calc non Af Amer: 13 mL/min — ABNORMAL LOW (ref >60)
GFR, EST AFRICAN AMERICAN: 16 mL/min — AB (ref >60)
GLUCOSE: 86 mg/dL (ref 65–99)
GLUCOSE: 99 mg/dL (ref 65–99)
PHOSPHORUS: 5.2 mg/dL — AB (ref 2.5–4.6)
POTASSIUM: 3.9 mmol/L (ref 3.5–5.1)
Phosphorus: 5.5 mg/dL — ABNORMAL HIGH (ref 2.5–4.6)
Potassium: 3.4 mmol/L — ABNORMAL LOW (ref 3.5–5.1)
SODIUM: 131 mmol/L — AB (ref 135–145)
Sodium: 131 mmol/L — ABNORMAL LOW (ref 135–145)

## 2017-10-25 LAB — COOXEMETRY PANEL
CARBOXYHEMOGLOBIN: 1.6 % — AB (ref 0.5–1.5)
METHEMOGLOBIN: 0.8 % (ref 0.0–1.5)
O2 SAT: 59.7 %
TOTAL HEMOGLOBIN: 12.7 g/dL (ref 12.0–16.0)

## 2017-10-25 LAB — MAGNESIUM: Magnesium: 2.2 mg/dL (ref 1.7–2.4)

## 2017-10-25 MED ORDER — POTASSIUM CHLORIDE CRYS ER 20 MEQ PO TBCR
40.0000 meq | EXTENDED_RELEASE_TABLET | Freq: Once | ORAL | Status: AC
  Administered 2017-10-25: 40 meq via ORAL
  Filled 2017-10-25: qty 2

## 2017-10-25 MED ORDER — POTASSIUM CHLORIDE CRYS ER 20 MEQ PO TBCR
40.0000 meq | EXTENDED_RELEASE_TABLET | Freq: Every day | ORAL | Status: DC
Start: 2017-10-26 — End: 2017-10-27
  Administered 2017-10-26: 40 meq via ORAL
  Filled 2017-10-25: qty 2

## 2017-10-25 MED ORDER — FUROSEMIDE 80 MG PO TABS
160.0000 mg | ORAL_TABLET | Freq: Three times a day (TID) | ORAL | Status: DC
Start: 2017-10-25 — End: 2017-10-28
  Administered 2017-10-25 – 2017-10-28 (×9): 160 mg via ORAL
  Filled 2017-10-25 (×10): qty 2

## 2017-10-25 NOTE — Progress Notes (Addendum)
Advanced Heart Failure Rounding Note   Subjective:    Lasix increased to 160 po tid yesterday. Urine output down but CVP improved.  Creatinine 4.2 -> 4.3. Denies dyspnea. BUN stable 48->49. Weight unchanged. CVP down to 8. Co-ox 60%  Denies SOB. Feels weak.  Walked with PT yesterday but required a walker.    RHC 10/16/2017  RA = 20 RV = 53/23 PA = 54/26 (39) PCW = 28 v =35 Fick cardiac output/index = 3.6/1.7 Thermo CO/CI = 7.8/3.6 PVR =1.8 WU SVR = 2022 Ao sat = 99% PA sat = 56%, 56% SVC sat 66%  Objective:     Vital Signs:   Temp:  [97.7 F (36.5 C)-98.6 F (37 C)] 97.8 F (36.6 C) (11/03 1127) Pulse Rate:  [94-110] 94 (11/03 1127) Resp:  [22-28] 22 (11/03 0746) BP: (95-122)/(67-94) 116/84 (11/03 1127) SpO2:  [95 %-100 %] 98 % (11/03 1127) Weight:  [82.1 kg (181 lb)] 82.1 kg (181 lb) (11/03 0429) Last BM Date: 10/23/17  Weight change: Filed Weights   10/23/17 0438 10/24/17 0507 10/25/17 0429  Weight: 83.7 kg (184 lb 8.4 oz) 82.1 kg (181 lb 1.6 oz) 82.1 kg (181 lb)    Intake/Output:   Intake/Output Summary (Last 24 hours) at 10/25/17 1211 Last data filed at 10/25/17 0915  Gross per 24 hour  Intake              960 ml  Output             1320 ml  Net             -360 ml     Physical Exam: CVP 8 General:  Sitting in bed . No resp difficulty HEENT: normal Neck: supple. JVP 8 Carotids 2+ bilat; no bruits. No lymphadenopathy or thryomegaly appreciated. Cor: PMI laterally displaced. Regular rate & rhythm. +s3 Lungs: clear Abdomen: soft, nontender, nondistended. No hepatosplenomegaly. No bruits or masses. Good bowel sounds. Extremities: no cyanosis, clubbing, rash, edema Neuro: alert & orientedx3, cranial nerves grossly intact. moves all 4 extremities w/o difficulty. Affect pleasant   Telemetry: Sinus tach 90-100 - personally reviewed.   Labs: Basic Metabolic Panel:  Recent Labs Lab 10/21/17 0630  10/22/17 0210 10/22/17 1502 10/23/17 1330  10/24/17 0437 10/24/17 1847 10/25/17 0556  NA 129*  < > 131* 128* 131* 129* 129* 131*  K 4.2  < > 3.8 4.2 3.6 3.5 3.3* 3.4*  CL 96*  < > 95* 95* 92* 93* 94* 94*  CO2 22  < > 25 22 25 24 23 24   GLUCOSE 83  < > 81 106* 96 82 118* 86  BUN 23*  < > 17 27* 39* 46* 48* 49*  CREATININE 3.17*  < > 2.72* 3.31* 3.81* 4.23* 4.24* 4.34*  CALCIUM 8.7*  < > 8.5* 8.6* 9.0 8.8* 8.6* 8.8*  MG 2.2  --  2.1  --  1.9 1.9  --  2.2  PHOS 4.6  < > 4.3 4.4 4.9* 5.6* 4.8* 5.5*  < > = values in this interval not displayed.  Liver Function Tests:  Recent Labs Lab 10/21/17 1415  10/22/17 1502 10/23/17 1330 10/24/17 0437 10/24/17 1847 10/25/17 0556  ALT 28  --   --   --   --   --   --   ALBUMIN  --   < > 2.7* 2.9* 2.8* 2.8* 2.8*  < > = values in this interval not displayed. No results for input(s): LIPASE, AMYLASE in the  last 168 hours. No results for input(s): AMMONIA in the last 168 hours.  CBC:  Recent Labs Lab 10/21/17 1416 10/22/17 0210 10/23/17 1330 10/24/17 0437 10/25/17 0555  WBC 7.1 9.0 8.8 9.8 8.4  HGB 11.2* 12.0* 13.2 12.6* 12.3*  HCT 33.0* 35.3* 37.9* 36.8* 35.6*  MCV 78.4 78.3 78.3 78.3 78.2  PLT 163 168 208 227 212    Cardiac Enzymes: No results for input(s): CKTOTAL, CKMB, CKMBINDEX, TROPONINI in the last 168 hours.  BNP: BNP (last 3 results)  Recent Labs  10/13/17 1045  BNP 3,149.4*    ProBNP (last 3 results) No results for input(s): PROBNP in the last 8760 hours.    Other results:  Imaging: No results found.   Medications:     Scheduled Medications: . amiodarone  200 mg Oral BID  . aspirin EC  81 mg Oral Daily  . calcitRIOL  0.5 mcg Oral Daily  . Chlorhexidine Gluconate Cloth  6 each Topical Daily  . furosemide  160 mg Oral BID  . heparin subcutaneous  5,000 Units Subcutaneous Q8H  . polyethylene glycol  17 g Oral Daily  . sodium chloride flush  10-40 mL Intracatheter Q12H  . sodium chloride flush  3 mL Intravenous Q12H  . sodium chloride flush   3 mL Intravenous Q12H  . tamsulosin  0.4 mg Oral QPC supper    Infusions: . sodium chloride    . sodium chloride Stopped (10/20/17 0700)  . sodium chloride 100 mL (10/23/17 0700)  . sodium chloride      PRN Medications: sodium chloride, sodium chloride, sodium chloride, sodium chloride, acetaminophen, alteplase, heparin, heparin, heparin, labetalol, lidocaine (PF), lidocaine-prilocaine, ondansetron (ZOFRAN) IV, pentafluoroprop-tetrafluoroeth, sodium chloride flush, sodium chloride flush, sodium chloride flush, sorbitol   Patient Profile   56 y/o male with h/o severe HTN, systolic HF with EF 25-30%, DM, CKD 4 admitted with decompensated HF and recent ICD shock.   Assessment/Plan   1. Acute on chronic systolic HF: NICM, cath 09/2016 no CAD. Echo 10/15/17 EF 20% grade II DD, moderate MR. Admitted with volume overload, VT/VF with ICD shock. Required CVVHD and inotropes.  - Remains very tenuous but creatinine, CVP and co-ox have stabilized today on po lasix. No need for HD today. No need for inotropes yet   - No beta blocker with marginal co ox.  - No ARB/ACE/ARNI or Cleda Daub with renal disease.  - I discussed with [facility] team at [facility]. If we can get out of hospital without HD they will see as outpatient for heart/kidney transplant w/u. If requires HD or inotropes will transfer as inpatient. I d/w him and he is amenable to this   2. VT/VF - Quiescent Continue Amio po.  - Mg 2.2. Today. K 3.4. Will supp   3. Elevated troponin - Due to HF, not ACS.  - Denies chest pain.   4. Acute on chronic renal failure stage 4 - Followed by Dr. Signe Colt.  - Baseline 3.0-3.5.  - Tolerated iHD 10/21/17 - As above. Creatinine stabilizing at 4.2. No need for HD today. Vein mapping done. Await decision on access placement.   5. HTN - Stable.  Off hydral with low BP.   6. Severe mitral regurgitation - likely functional MR. - Moderate MR on repeat Echo.  - No change.   7. Previous CVA - mild  R-sided deficits.  - Continue PT/OT.   8. Deconditioning- PT recommending HH. Order placed .     Length of Stay: 12  Gates Jividen, Reuel Boom,  MD  10/25/2017, 12:11 PM  Advanced Heart Failure Team Pager [phone] (M-F; 7a - 4p)  Please contact [facility] for night-coverage after hours (4p -7a ) and weekends on [email or web address]

## 2017-10-25 NOTE — Progress Notes (Signed)
Patient is using the CPAP tonight for the 2nd night.  He is alert and oriented, no pain of any kind, and output at 300 mls.  CVP reading 11.

## 2017-10-25 NOTE — Progress Notes (Addendum)
S: No complaints.  Cr hopefully plateauing at 4.2--> 4.3.    O:BP 116/84 (BP Location: Left Arm)   Pulse 94   Temp 97.8 F (36.6 C) (Axillary)   Resp (!) 22   Ht 5\' 11"  (1.803 m)   Wt 82.1 kg (181 lb)   SpO2 98%   BMI 25.24 kg/m   Intake/Output Summary (Last 24 hours) at 10/25/17 1307 Last data filed at 10/25/17 0915  Gross per 24 hour  Intake              720 ml  Output             1020 ml  Net             -300 ml   Intake/Output: I/O last 3 completed shifts: In: 1321.8 [P.O.:1200; I.V.:121.8] Out: 2075 [Urine:2075]  Intake/Output this shift:  Total I/O In: 240 [P.O.:240] Out: 320 [Urine:320] Weight change: -0.045 kg (-1.6 oz)  Gen: well-developed gentleman, sitting in bed NECK: R IJ nontunneled HD cath in place CVS: tachycardia with regular rate. Resp: clear bilaterally. Abd: Soft, non tender, bowel sounds positive.  Ext: Trace  lower extremity edema .   Recent Labs Lab 10/21/17 1415 10/21/17 1600 10/22/17 0210 10/22/17 1502 10/23/17 1330 10/24/17 0437 10/24/17 1847 10/25/17 0556  NA  --  130* 131* 128* 131* 129* 129* 131*  K  --  3.5 3.8 4.2 3.6 3.5 3.3* 3.4*  CL  --  95* 95* 95* 92* 93* 94* 94*  CO2  --  27 25 22 25 24 23 24   GLUCOSE  --  101* 81 106* 96 82 118* 86  BUN  --  11 17 27* 39* 46* 48* 49*  CREATININE  --  2.10* 2.72* 3.31* 3.81* 4.23* 4.24* 4.34*  ALBUMIN  --  2.7* 2.6* 2.7* 2.9* 2.8* 2.8* 2.8*  CALCIUM  --  8.3* 8.5* 8.6* 9.0 8.8* 8.6* 8.8*  PHOS  --  3.2 4.3 4.4 4.9* 5.6* 4.8* 5.5*  ALT 28  --   --   --   --   --   --   --    Liver Function Tests:  Recent Labs Lab 10/21/17 1415  10/24/17 0437 10/24/17 1847 10/25/17 0556  ALT 28  --   --   --   --   ALBUMIN  --   < > 2.8* 2.8* 2.8*  < > = values in this interval not displayed. No results for input(s): LIPASE, AMYLASE in the last 168 hours. No results for input(s): AMMONIA in the last 168 hours. CBC:  Recent Labs Lab 10/21/17 1416 10/22/17 0210 10/23/17 1330 10/24/17 0437  10/25/17 0555  WBC 7.1 9.0 8.8 9.8 8.4  HGB 11.2* 12.0* 13.2 12.6* 12.3*  HCT 33.0* 35.3* 37.9* 36.8* 35.6*  MCV 78.4 78.3 78.3 78.3 78.2  PLT 163 168 208 227 212   Cardiac Enzymes: No results for input(s): CKTOTAL, CKMB, CKMBINDEX, TROPONINI in the last 168 hours. CBG: No results for input(s): GLUCAP in the last 168 hours.  Iron Studies: No results for input(s): IRON, TIBC, TRANSFERRIN, FERRITIN in the last 72 hours. Studies/Results: No results found. Marland Kitchen amiodarone  200 mg Oral BID  . aspirin EC  81 mg Oral Daily  . calcitRIOL  0.5 mcg Oral Daily  . Chlorhexidine Gluconate Cloth  6 each Topical Daily  . furosemide  160 mg Oral TID  . heparin subcutaneous  5,000 Units Subcutaneous Q8H  . polyethylene glycol  17 g Oral Daily  .  potassium chloride  40 mEq Oral Once  . sodium chloride flush  10-40 mL Intracatheter Q12H  . sodium chloride flush  3 mL Intravenous Q12H  . sodium chloride flush  3 mL Intravenous Q12H  . tamsulosin  0.4 mg Oral QPC supper    BMET    Component Value Date/Time   NA 131 (L) 10/25/2017 0556   K 3.4 (L) 10/25/2017 0556   CL 94 (L) 10/25/2017 0556   CO2 24 10/25/2017 0556   GLUCOSE 86 10/25/2017 0556   BUN 49 (H) 10/25/2017 0556   CREATININE 4.34 (H) 10/25/2017 0556   CALCIUM 8.8 (L) 10/25/2017 0556   GFRNONAA 14 (L) 10/25/2017 0556   GFRAA 16 (L) 10/25/2017 0556   CBC    Component Value Date/Time   WBC 8.4 10/25/2017 0555   RBC 4.55 10/25/2017 0555   HGB 12.3 (L) 10/25/2017 0555   HCT 35.6 (L) 10/25/2017 0555   PLT 212 10/25/2017 0555   MCV 78.2 10/25/2017 0555   MCH 27.0 10/25/2017 0555   MCHC 34.6 10/25/2017 0555   RDW 16.0 (H) 10/25/2017 0555   LYMPHSABS 1.4 10/13/2017 1045   MONOABS 0.6 10/13/2017 1045   EOSABS 0.1 10/13/2017 1045   BASOSABS 0.0 10/13/2017 1045     Assessment/Plan:  1.  AKI with CKD 4: was on CRRT and had one IHD Monday.  I have increased his Lasix to 160 TID today and I've also added standing K repletion to  start tomorrow.  We will continue to make daily assessments for the need for dialysis.  .Vein mapping complete  No indication for HD today.   2. HFrEF: Heart/ kidney eval eventually; [facility]    3.  Anemia.Currently hemoglobin stable. Feraheme was given on 10/18/2017. -continue monitoring.  4.  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 -Calcitriol 0.5 per day.  5.  Afib  On amio  6.  Hyponatremia: volume related, improving  Bufford ButtnerElizabeth Stokes Rattigan MD [facility] pgr [phone]

## 2017-10-25 NOTE — Progress Notes (Signed)
Pt is on CPAP for the night 

## 2017-10-26 LAB — CBC
HEMATOCRIT: 36.2 % — AB (ref 39.0–52.0)
Hemoglobin: 12.5 g/dL — ABNORMAL LOW (ref 13.0–17.0)
MCH: 27.2 pg (ref 26.0–34.0)
MCHC: 34.5 g/dL (ref 30.0–36.0)
MCV: 78.9 fL (ref 78.0–100.0)
PLATELETS: 249 10*3/uL (ref 150–400)
RBC: 4.59 MIL/uL (ref 4.22–5.81)
RDW: 16.5 % — ABNORMAL HIGH (ref 11.5–15.5)
WBC: 8.9 10*3/uL (ref 4.0–10.5)

## 2017-10-26 LAB — RENAL FUNCTION PANEL
ALBUMIN: 2.9 g/dL — AB (ref 3.5–5.0)
Albumin: 2.9 g/dL — ABNORMAL LOW (ref 3.5–5.0)
Anion gap: 13 (ref 5–15)
Anion gap: 10 (ref 5–15)
BUN: 54 mg/dL — ABNORMAL HIGH (ref 6–20)
BUN: 59 mg/dL — AB (ref 6–20)
CALCIUM: 8.8 mg/dL — AB (ref 8.9–10.3)
CHLORIDE: 96 mmol/L — AB (ref 101–111)
CO2: 22 mmol/L (ref 22–32)
CO2: 24 mmol/L (ref 22–32)
CREATININE: 4.49 mg/dL — AB (ref 0.61–1.24)
Calcium: 8.8 mg/dL — ABNORMAL LOW (ref 8.9–10.3)
Chloride: 96 mmol/L — ABNORMAL LOW (ref 101–111)
Creatinine, Ser: 4.6 mg/dL — ABNORMAL HIGH (ref 0.61–1.24)
GFR calc Af Amer: 15 mL/min — ABNORMAL LOW (ref >60)
GFR, EST AFRICAN AMERICAN: 15 mL/min — AB (ref >60)
GFR, EST NON AFRICAN AMERICAN: 13 mL/min — AB (ref >60)
GFR, EST NON AFRICAN AMERICAN: 13 mL/min — AB (ref >60)
GLUCOSE: 96 mg/dL (ref 65–99)
Glucose, Bld: 85 mg/dL (ref 65–99)
PHOSPHORUS: 4.9 mg/dL — AB (ref 2.5–4.6)
POTASSIUM: 3.7 mmol/L (ref 3.5–5.1)
Phosphorus: 5.2 mg/dL — ABNORMAL HIGH (ref 2.5–4.6)
Potassium: 4 mmol/L (ref 3.5–5.1)
SODIUM: 130 mmol/L — AB (ref 135–145)
Sodium: 131 mmol/L — ABNORMAL LOW (ref 135–145)

## 2017-10-26 LAB — COOXEMETRY PANEL
CARBOXYHEMOGLOBIN: 1.2 % (ref 0.5–1.5)
METHEMOGLOBIN: 0.8 % (ref 0.0–1.5)
O2 Saturation: 65 %
Total hemoglobin: 13.7 g/dL (ref 12.0–16.0)

## 2017-10-26 LAB — MAGNESIUM: Magnesium: 2.1 mg/dL (ref 1.7–2.4)

## 2017-10-26 MED ORDER — POTASSIUM CHLORIDE CRYS ER 20 MEQ PO TBCR
20.0000 meq | EXTENDED_RELEASE_TABLET | Freq: Once | ORAL | Status: AC
  Administered 2017-10-26: 20 meq via ORAL
  Filled 2017-10-26: qty 1

## 2017-10-26 NOTE — Progress Notes (Signed)
Advanced Heart Failure Rounding Note   Subjective:    Remains on lasix 160 po tid. Weight down 4 pounds. Breathing ok. No CP.  Creatinine 4.54-> 4.49    CVP down to 10-11. Co-ox 65%    RHC 10/16/2017  RA = 20 RV = 53/23 PA = 54/26 (39) PCW = 28 v =35 Fick cardiac output/index = 3.6/1.7 Thermo CO/CI = 7.8/3.6 PVR =1.8 WU SVR = 2022 Ao sat = 99% PA sat = 56%, 56% SVC sat 66%  Objective:     Vital Signs:   Temp:  [97.7 F (36.5 C)-98.3 F (36.8 C)] 98.1 F (36.7 C) (11/04 1150) Pulse Rate:  [107-113] 109 (11/04 1150) Resp:  [22-35] 35 (11/04 1150) BP: (103-133)/(63-99) 103/89 (11/04 1150) SpO2:  [96 %-100 %] 96 % (11/04 1150) Weight:  [80.5 kg (177 lb 8 oz)] 80.5 kg (177 lb 8 oz) (11/04 0449) Last BM Date: 10/25/17  Weight change: Filed Weights   10/24/17 0507 10/25/17 0429 10/26/17 0449  Weight: 82.1 kg (181 lb 1.6 oz) 82.1 kg (181 lb) 80.5 kg (177 lb 8 oz)    Intake/Output:   Intake/Output Summary (Last 24 hours) at 10/26/2017 1153 Last data filed at 10/26/2017 0900 Gross per 24 hour  Intake 1063 ml  Output 1120 ml  Net -57 ml     Physical Exam: CVP 10-11 General:  Lying in bed  No resp difficulty HEENT: normal Neck: supple. JVP to jaw . RIJ trialysis cath.  Carotids 2+ bilat; no bruits. No lymphadenopathy or thryomegaly appreciated. Cor: PMI laterally displaced. Regular rate & rhythm. +s3 Lungs: clear Abdomen: soft, nontender, nondistended. No hepatosplenomegaly. No bruits or masses. Good bowel sounds. Extremities: no cyanosis, clubbing, rash, edema  Neuro: alert & orientedx3, cranial nerves grossly intact. moves all 4 extremities w/o difficulty. Affect pleasant   Telemetry: Sinus tach 100-110 - personally reviewed.   Labs: Basic Metabolic Panel: Recent Labs  Lab 10/22/17 0210  10/23/17 1330 10/24/17 0437 10/24/17 1847 10/25/17 0556 10/25/17 1715 10/26/17 0254  NA 131*   < > 131* 129* 129* 131* 131* 131*  K 3.8   < > 3.6 3.5 3.3*  3.4* 3.9 3.7  CL 95*   < > 92* 93* 94* 94* 96* 96*  CO2 25   < > 25 24 23 24 23 22   GLUCOSE 81   < > 96 82 118* 86 99 85  BUN 17   < > 39* 46* 48* 49* 52* 54*  CREATININE 2.72*   < > 3.81* 4.23* 4.24* 4.34* 4.54* 4.49*  CALCIUM 8.5*   < > 9.0 8.8* 8.6* 8.8* 8.7* 8.8*  MG 2.1  --  1.9 1.9  --  2.2  --  2.1  PHOS 4.3   < > 4.9* 5.6* 4.8* 5.5* 5.2* 5.2*   < > = values in this interval not displayed.    Liver Function Tests: Recent Labs  Lab 10/21/17 1415  10/24/17 0437 10/24/17 1847 10/25/17 0556 10/25/17 1715 10/26/17 0254  ALT 28  --   --   --   --   --   --   ALBUMIN  --    < > 2.8* 2.8* 2.8* 2.9* 2.9*   < > = values in this interval not displayed.   No results for input(s): LIPASE, AMYLASE in the last 168 hours. No results for input(s): AMMONIA in the last 168 hours.  CBC: Recent Labs  Lab 10/22/17 0210 10/23/17 1330 10/24/17 0437 10/25/17 0555 10/26/17 0254  WBC 9.0 8.8 9.8 8.4 8.9  HGB 12.0* 13.2 12.6* 12.3* 12.5*  HCT 35.3* 37.9* 36.8* 35.6* 36.2*  MCV 78.3 78.3 78.3 78.2 78.9  PLT 168 208 227 212 249    Cardiac Enzymes: No results for input(s): CKTOTAL, CKMB, CKMBINDEX, TROPONINI in the last 168 hours.  BNP: BNP (last 3 results) Recent Labs    10/13/17 1045  BNP 3,149.4*    ProBNP (last 3 results) No results for input(s): PROBNP in the last 8760 hours.    Other results:  Imaging: No results found.   Medications:     Scheduled Medications: . amiodarone  200 mg Oral BID  . aspirin EC  81 mg Oral Daily  . calcitRIOL  0.5 mcg Oral Daily  . Chlorhexidine Gluconate Cloth  6 each Topical Daily  . furosemide  160 mg Oral TID  . heparin subcutaneous  5,000 Units Subcutaneous Q8H  . polyethylene glycol  17 g Oral Daily  . potassium chloride  40 mEq Oral Daily  . sodium chloride flush  10-40 mL Intracatheter Q12H  . sodium chloride flush  3 mL Intravenous Q12H  . sodium chloride flush  3 mL Intravenous Q12H  . tamsulosin  0.4 mg Oral QPC  supper    Infusions: . sodium chloride    . sodium chloride Stopped (10/20/17 0700)  . sodium chloride 100 mL (10/23/17 0700)  . sodium chloride      PRN Medications: sodium chloride, sodium chloride, sodium chloride, sodium chloride, acetaminophen, alteplase, heparin, heparin, heparin, lidocaine (PF), lidocaine-prilocaine, ondansetron (ZOFRAN) IV, pentafluoroprop-tetrafluoroeth, sodium chloride flush, sodium chloride flush, sodium chloride flush, sorbitol   Patient Profile   56 y/o male with h/o severe HTN, systolic HF with EF 25-30%, DM, CKD 4 admitted with decompensated HF and recent ICD shock.   Assessment/Plan   1. Acute on chronic systolic HF: NICM, cath 09/2016 no CAD. Echo 10/15/17 EF 20% grade II DD, moderate MR. Admitted with volume overload, VT/VF with ICD shock. Required CVVHD and inotropes.  - Remains very tenuous creatinine up from baseline but stable. Volume status and co-ox ok. Off all HF meds due to AKI and low BP - Renal has seen and following to assess need for HD initiation and decision on access placement - No beta blocker with marginal co ox.  - No ARB/ACE/ARNI or Cleda Daub with renal disease.  - I discussed with transplant team at [facility]. If we can get out of hospital with or without HD they will see as outpatient for heart/kidney transplant w/u. If unable to tolerate HD or needs inotropes in house will transfer as inpatient. I d/w him and he is amenable to this   2. VT/VF - Quiescent Continue Amio po.  - Mg 2.1. Today. K 3.7. Will supp. Discussed with PharmD personally.  3. Elevated troponin - Due to HF, not ACS.  - Denies chest pain.   4. Acute on chronic renal failure stage 4 - Followed by Nephrology - Baseline 3.0-3.5.  - Tolerated iHD 10/21/17 - Creatinine stabilizing at 4.4-4.5. May be a bit overdiuresed No need for HD today. Vein mapping done. Await decision on access placement and need for iHD per Renal   5. HTN - Stable.  Off hydral with low BP.     6. Severe mitral regurgitation - likely functional MR. - Moderate MR on repeat Echo.  - No change.   7. Previous CVA - mild R-sided deficits.  - Continue PT/OT.   8. Deconditioning- PT recommending HH.  Length of Stay: 13  Arvilla MeresBensimhon, Kharlie Bring, MD  10/26/2017, 11:53 AM  Advanced Heart Failure Team Pager [phone] (M-F; 7a - 4p)  Please contact [facility] for night-coverage after hours (4p -7a ) and weekends on [email or web address]

## 2017-10-26 NOTE — Progress Notes (Signed)
S: No complaints.  Cr still inching upwards.  Essentially net even yesterday.  O:BP (!) 127/99 (BP Location: Left Arm)   Pulse (!) 109   Temp 98.2 F (36.8 C) (Oral)   Resp (!) 30   Ht 5\' 11"  (1.803 m)   Wt 80.5 kg (177 lb 8 oz)   SpO2 99%   BMI 24.76 kg/m   Intake/Output Summary (Last 24 hours) at 10/26/2017 1035 Last data filed at 10/26/2017 0900 Gross per 24 hour  Intake 1063 ml  Output 1120 ml  Net -57 ml   Intake/Output: I/O last 3 completed shifts: In: 1423 [P.O.:1400; I.V.:23] Out: 2140 [Urine:2140]  Intake/Output this shift:  Total I/O In: 120 [P.O.:120] Out: -  Weight change: -1.588 kg (-8 oz)  Gen: well-developed gentleman, sitting in bed NECK: R IJ nontunneled HD cath in place CVS: tachycardia with regular rate. Resp: clear bilaterally., muffled at bases Abd: Soft, non tender, bowel sounds positive.  Ext: Trace  lower extremity edema .  Recent Labs  Lab 10/21/17 1415  10/22/17 1502 10/23/17 1330 10/24/17 0437 10/24/17 1847 10/25/17 0556 10/25/17 1715 10/26/17 0254  NA  --    < > 128* 131* 129* 129* 131* 131* 131*  K  --    < > 4.2 3.6 3.5 3.3* 3.4* 3.9 3.7  CL  --    < > 95* 92* 93* 94* 94* 96* 96*  CO2  --    < > 22 25 24 23 24 23 22   GLUCOSE  --    < > 106* 96 82 118* 86 99 85  BUN  --    < > 27* 39* 46* 48* 49* 52* 54*  CREATININE  --    < > 3.31* 3.81* 4.23* 4.24* 4.34* 4.54* 4.49*  ALBUMIN  --    < > 2.7* 2.9* 2.8* 2.8* 2.8* 2.9* 2.9*  CALCIUM  --    < > 8.6* 9.0 8.8* 8.6* 8.8* 8.7* 8.8*  PHOS  --    < > 4.4 4.9* 5.6* 4.8* 5.5* 5.2* 5.2*  ALT 28  --   --   --   --   --   --   --   --    < > = values in this interval not displayed.   Liver Function Tests: Recent Labs  Lab 10/21/17 1415  10/25/17 0556 10/25/17 1715 10/26/17 0254  ALT 28  --   --   --   --   ALBUMIN  --    < > 2.8* 2.9* 2.9*   < > = values in this interval not displayed.   No results for input(s): LIPASE, AMYLASE in the last 168 hours. No results for input(s): AMMONIA  in the last 168 hours. CBC: Recent Labs  Lab 10/22/17 0210 10/23/17 1330 10/24/17 0437 10/25/17 0555 10/26/17 0254  WBC 9.0 8.8 9.8 8.4 8.9  HGB 12.0* 13.2 12.6* 12.3* 12.5*  HCT 35.3* 37.9* 36.8* 35.6* 36.2*  MCV 78.3 78.3 78.3 78.2 78.9  PLT 168 208 227 212 249   Cardiac Enzymes: No results for input(s): CKTOTAL, CKMB, CKMBINDEX, TROPONINI in the last 168 hours. CBG: No results for input(s): GLUCAP in the last 168 hours.  Iron Studies: No results for input(s): IRON, TIBC, TRANSFERRIN, FERRITIN in the last 72 hours. Studies/Results: No results found. Marland Kitchen amiodarone  200 mg Oral BID  . aspirin EC  81 mg Oral Daily  . calcitRIOL  0.5 mcg Oral Daily  . Chlorhexidine Gluconate Cloth  6  each Topical Daily  . furosemide  160 mg Oral TID  . heparin subcutaneous  5,000 Units Subcutaneous Q8H  . polyethylene glycol  17 g Oral Daily  . potassium chloride  40 mEq Oral Daily  . sodium chloride flush  10-40 mL Intracatheter Q12H  . sodium chloride flush  3 mL Intravenous Q12H  . sodium chloride flush  3 mL Intravenous Q12H  . tamsulosin  0.4 mg Oral QPC supper    BMET    Component Value Date/Time   NA 131 (L) 10/26/2017 0254   K 3.7 10/26/2017 0254   CL 96 (L) 10/26/2017 0254   CO2 22 10/26/2017 0254   GLUCOSE 85 10/26/2017 0254   BUN 54 (H) 10/26/2017 0254   CREATININE 4.49 (H) 10/26/2017 0254   CALCIUM 8.8 (L) 10/26/2017 0254   GFRNONAA 13 (L) 10/26/2017 0254   GFRAA 15 (L) 10/26/2017 0254   CBC    Component Value Date/Time   WBC 8.9 10/26/2017 0254   RBC 4.59 10/26/2017 0254   HGB 12.5 (L) 10/26/2017 0254   HCT 36.2 (L) 10/26/2017 0254   PLT 249 10/26/2017 0254   MCV 78.9 10/26/2017 0254   MCH 27.2 10/26/2017 0254   MCHC 34.5 10/26/2017 0254   RDW 16.5 (H) 10/26/2017 0254   LYMPHSABS 1.4 10/13/2017 1045   MONOABS 0.6 10/13/2017 1045   EOSABS 0.1 10/13/2017 1045   BASOSABS 0.0 10/13/2017 1045     Assessment/Plan:  1.  AKI with CKD 4: was on CRRT and had one  IHD Monday 10/29.  I have increased his Lasix to 160 TID today and I've also added standing K repletion.  Vein mapping complete  No indication for HD today--> but if Cr continues to rise I forsee him becoming dialysis dependent during this hospital admission.    2. HFrEF: Heart/ kidney eval eventually; [facility]    3.  Anemia.Currently hemoglobin stable. Feraheme was given on 10/18/2017. -continue monitoring.  4.  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 -Calcitriol 0.5 per day.  5.  Afib  On amio  6.  Hyponatremia: volume related, improving  Bufford ButtnerElizabeth Daniella Dewberry MD [facility] pgr [phone]

## 2017-10-27 ENCOUNTER — Other Ambulatory Visit: Payer: Self-pay

## 2017-10-27 LAB — RENAL FUNCTION PANEL
ALBUMIN: 3 g/dL — AB (ref 3.5–5.0)
ALBUMIN: 3 g/dL — AB (ref 3.5–5.0)
ANION GAP: 10 (ref 5–15)
Anion gap: 15 (ref 5–15)
BUN: 60 mg/dL — AB (ref 6–20)
BUN: 61 mg/dL — ABNORMAL HIGH (ref 6–20)
CALCIUM: 9.1 mg/dL (ref 8.9–10.3)
CO2: 23 mmol/L (ref 22–32)
CO2: 22 mmol/L (ref 22–32)
Calcium: 9 mg/dL (ref 8.9–10.3)
Chloride: 94 mmol/L — ABNORMAL LOW (ref 101–111)
Chloride: 99 mmol/L — ABNORMAL LOW (ref 101–111)
Creatinine, Ser: 4.55 mg/dL — ABNORMAL HIGH (ref 0.61–1.24)
Creatinine, Ser: 4.69 mg/dL — ABNORMAL HIGH (ref 0.61–1.24)
GFR calc Af Amer: 15 mL/min — ABNORMAL LOW (ref >60)
GFR calc non Af Amer: 13 mL/min — ABNORMAL LOW (ref >60)
GFR, EST AFRICAN AMERICAN: 14 mL/min — AB (ref >60)
GFR, EST NON AFRICAN AMERICAN: 13 mL/min — AB (ref >60)
GLUCOSE: 101 mg/dL — AB (ref 65–99)
Glucose, Bld: 81 mg/dL (ref 65–99)
PHOSPHORUS: 5 mg/dL — AB (ref 2.5–4.6)
PHOSPHORUS: 4.9 mg/dL — AB (ref 2.5–4.6)
POTASSIUM: 3.8 mmol/L (ref 3.5–5.1)
POTASSIUM: 4.5 mmol/L (ref 3.5–5.1)
SODIUM: 132 mmol/L — AB (ref 135–145)
Sodium: 131 mmol/L — ABNORMAL LOW (ref 135–145)

## 2017-10-27 LAB — MAGNESIUM: Magnesium: 2.1 mg/dL (ref 1.7–2.4)

## 2017-10-27 LAB — CBC
HCT: 35.8 % — ABNORMAL LOW (ref 39.0–52.0)
Hemoglobin: 12.1 g/dL — ABNORMAL LOW (ref 13.0–17.0)
MCH: 27.2 pg (ref 26.0–34.0)
MCHC: 33.8 g/dL (ref 30.0–36.0)
MCV: 80.4 fL (ref 78.0–100.0)
Platelets: 272 10*3/uL (ref 150–400)
RBC: 4.45 MIL/uL (ref 4.22–5.81)
RDW: 18.3 % — AB (ref 11.5–15.5)
WBC: 8.5 10*3/uL (ref 4.0–10.5)

## 2017-10-27 LAB — COOXEMETRY PANEL
Carboxyhemoglobin: 1.6 % — ABNORMAL HIGH (ref 0.5–1.5)
Methemoglobin: 0.7 % (ref 0.0–1.5)
O2 SAT: 65.9 %
TOTAL HEMOGLOBIN: 12.6 g/dL (ref 12.0–16.0)

## 2017-10-27 MED ORDER — POTASSIUM CHLORIDE CRYS ER 20 MEQ PO TBCR
60.0000 meq | EXTENDED_RELEASE_TABLET | Freq: Every day | ORAL | Status: DC
  Administered 2017-10-27: 60 meq via ORAL
  Filled 2017-10-27: qty 3

## 2017-10-27 NOTE — Care Management Important Message (Signed)
Important Message  Patient Details  Name: Edgar [name] MRN: [id number] Date of Birth: 03/31/1961   Medicare Important Message Given:  Yes    Arlesia Kiel Abena 10/27/2017, 10:44 AM

## 2017-10-27 NOTE — Progress Notes (Addendum)
Advanced Heart Failure Rounding Note   Subjective:    Continue to diurese with 160 mg po lasix three times a day. Making ~1.5 liters urine. Creatinine stable at 4.5. Co-ox 66%  Denies CP/SOB. Does admit to mild dyspnea with exertion. Very weak. Has not been out of bed much.    RHC 10/16/2017  RA = 20 RV = 53/23 PA = 54/26 (39) PCW = 28 v =35 Fick cardiac output/index = 3.6/1.7 Thermo CO/CI = 7.8/3.6 PVR =1.8 WU SVR = 2022 Ao sat = 99% PA sat = 56%, 56% SVC sat 66%  Objective:     Vital Signs:   Temp:  [97.8 F (36.6 C)-98.8 F (37.1 C)] 97.8 F (36.6 C) (11/05 0430) Pulse Rate:  [104-116] 106 (11/05 0550) Resp:  [14-35] 19 (11/05 0550) BP: (103-131)/(45-99) 108/45 (11/05 0550) SpO2:  [96 %-100 %] 100 % (11/05 0550) Weight:  [180 lb 6.4 oz (81.8 kg)] 180 lb 6.4 oz (81.8 kg) (11/05 0436) Last BM Date: 10/26/17  Weight change: Filed Weights   10/25/17 0429 10/26/17 0449 10/27/17 0436  Weight: 181 lb (82.1 kg) 177 lb 8 oz (80.5 kg) 180 lb 6.4 oz (81.8 kg)    Intake/Output:   Intake/Output Summary (Last 24 hours) at 10/27/2017 0747 Last data filed at 10/27/2017 0431 Gross per 24 hour  Intake 540 ml  Output 1500 ml  Net -960 ml     Physical Exam: CVP 7 personally checked.  General:  Weak appearing. No resp difficulty. In bed HEENT: normal Neck: supple. JVP 7-8. Carotids 2+ bilat; no bruits. No lymphadenopathy or thryomegaly appreciated. RIJ trialysis catheter.  Cor: PMI laterally displaced. Tachy regular No rubs, or murmurs. + S3  Lungs: clear Abdomen: soft, nontender, nondistended. No hepatosplenomegaly. No bruits or masses. Good bowel sounds. Extremities: no cyanosis, clubbing, rash, edema Neuro: alert & orientedx3, cranial nerves grossly intact. moves all 4 extremities w/o difficulty. Affect pleasant    Telemetry: Sinus Tach 100s personally reviewed.   Labs: Basic Metabolic Panel: Recent Labs  Lab 10/23/17 1330 10/24/17 0437  10/25/17 0556  10/25/17 1715 10/26/17 0254 10/26/17 1549 10/27/17 0644  NA 131* 129*   < > 131* 131* 131* 130* 132*  K 3.6 3.5   < > 3.4* 3.9 3.7 4.0 3.8  CL 92* 93*   < > 94* 96* 96* 96* 94*  CO2 25 24   < > 24 23 22 24 23   GLUCOSE 96 82   < > 86 99 85 96 81  BUN 39* 46*   < > 49* 52* 54* 59* 60*  CREATININE 3.81* 4.23*   < > 4.34* 4.54* 4.49* 4.60* 4.55*  CALCIUM 9.0 8.8*   < > 8.8* 8.7* 8.8* 8.8* 9.1  MG 1.9 1.9  --  2.2  --  2.1  --  2.1  PHOS 4.9* 5.6*   < > 5.5* 5.2* 5.2* 4.9* 5.0*   < > = values in this interval not displayed.    Liver Function Tests: Recent Labs  Lab 10/21/17 1415  10/25/17 0556 10/25/17 1715 10/26/17 0254 10/26/17 1549 10/27/17 0644  ALT 28  --   --   --   --   --   --   ALBUMIN  --    < > 2.8* 2.9* 2.9* 2.9* 3.0*   < > = values in this interval not displayed.   No results for input(s): LIPASE, AMYLASE in the last 168 hours. No results for input(s): AMMONIA in the last  168 hours.  CBC: Recent Labs  Lab 10/23/17 1330 10/24/17 0437 10/25/17 0555 10/26/17 0254 10/27/17 0515  WBC 8.8 9.8 8.4 8.9 8.5  HGB 13.2 12.6* 12.3* 12.5* 12.1*  HCT 37.9* 36.8* 35.6* 36.2* 35.8*  MCV 78.3 78.3 78.2 78.9 80.4  PLT 208 227 212 249 272    Cardiac Enzymes: No results for input(s): CKTOTAL, CKMB, CKMBINDEX, TROPONINI in the last 168 hours.  BNP: BNP (last 3 results) Recent Labs    10/13/17 1045  BNP 3,149.4*    ProBNP (last 3 results) No results for input(s): PROBNP in the last 8760 hours.    Other results:  Imaging: No results found.   Medications:     Scheduled Medications: . amiodarone  200 mg Oral BID  . aspirin EC  81 mg Oral Daily  . calcitRIOL  0.5 mcg Oral Daily  . Chlorhexidine Gluconate Cloth  6 each Topical Daily  . furosemide  160 mg Oral TID  . heparin subcutaneous  5,000 Units Subcutaneous Q8H  . polyethylene glycol  17 g Oral Daily  . potassium chloride  40 mEq Oral Daily  . sodium chloride flush  10-40 mL Intracatheter Q12H  .  sodium chloride flush  3 mL Intravenous Q12H  . sodium chloride flush  3 mL Intravenous Q12H  . tamsulosin  0.4 mg Oral QPC supper    Infusions: . sodium chloride    . sodium chloride Stopped (10/20/17 0700)  . sodium chloride 100 mL (10/23/17 0700)  . sodium chloride      PRN Medications: sodium chloride, sodium chloride, sodium chloride, sodium chloride, acetaminophen, alteplase, heparin, heparin, heparin, lidocaine (PF), lidocaine-prilocaine, ondansetron (ZOFRAN) IV, pentafluoroprop-tetrafluoroeth, sodium chloride flush, sodium chloride flush, sodium chloride flush, sorbitol   Patient Profile   56 y/o male with h/o severe HTN, systolic HF with EF 25-30%, DM, CKD 4 admitted with decompensated HF and recent ICD shock.   Assessment/Plan   1. Acute on chronic systolic HF: NICM, cath 09/2016 no CAD. Echo 10/15/17 EF 20% grade II DD, moderate MR. Admitted with volume overload, VT/VF with ICD shock. Required CVVHD and inotropes.  - Volume status stable. CVP 7 -Continue current dose of lasix.  - No beta blocker. Todays CO-OX is 66%. Stable.   - No ARB/ACE/ARNI or Cleda DaubSpiro with renal disease.  - Dr Gala RomneyBensimhon [facility]. If we can get out of hospital with or without HD they will see as outpatient for heart/kidney transplant w/u. If unable to tolerate HD or needs inotropes in house will transfer as inpatient. I d/w him and he is amenable to this   2. VT/VF - Quiescent Continue Amio po.  - Mag 2.1 - K 3.8. Supplement K.    Discussed with PharmD personally.  3. Elevated troponin - Due to HF, not ACS.  - No chest pain.   4. Acute on chronic renal failure stage 4 - Followed by Nephrology - Tolerated iHD 10/21/17 - Creatinine stabilizing at 4.4-4.5. Making ~1.5 liters urine.  - Todays creatinine 4.55.  Marland Kitchen. Vein mapping done. Await decision on access placement and need for iHD per Renal   5. HTN - Stable.  Off hydral with low BP.   6. Severe mitral  regurgitation - likely functional MR. - Moderate MR on repeat Echo.  - No change.   7. Previous CVA - mild R-sided deficits.  - Continue PT/OT.   8. Deconditioning- PT recommending HH.   Disposition: [facility] ordered HHRN/PT and Rolling walker + 3N1.  Length of Stay: 14  Amy Clegg, NP  10/27/2017, 7:47 AM  Advanced Heart Failure Team Pager [phone] (M-F; 7a - 4p)  Please contact [facility] for night-coverage after hours (4p -7a ) and weekends on [email or web address]  Patient seen and examined with Tonye Becket, NP. We discussed all aspects of the encounter. I agree with the assessment and plan as stated above.   Case d/w with Dr. Hyman Hopes in Nephrology at length. He remains extremely tenuous.  Creatinine up a bot from baseline but seems to be plateauing around 4.5. CVP stable on high-dose po lasix. Co-ox ok off inotropes. Will follow off HD for one more day. If creatine and CVP stable will pull trialysis cath tomorrow. Continue to follow co-ox while trialysis cath in. Hopefully we can get him home soon off HD and can maintain volume status on po lasix. We again discussed the possibility of heart kidney transplant eval which he consents to when needed. Stop scheduled Kcl with K 4.5.   Arvilla Meres, MD  8:07 PM

## 2017-10-27 NOTE — Progress Notes (Signed)
Occupational Therapy Treatment Patient Details Name: Edgar [name] MRN: [id number] DOB: Dec 02, 1961 Today's Date: 10/27/2017    History of present illness 56 y/o male with h/o severe HTN, systolic HF with EF 25-30%, DM, CKD 4 admitted with decompensated HF and recent ICD shock. Recovery complicated by cardiorenal syndrome   OT comments  Pt performed toileting with min assist and seated grooming at sink with set up. Unable to recall any energy conservation strategies, reeducated. Educated in multiple benefits of 3 in 1, but pt continues to decline.   Follow Up Recommendations  Home health OT    Equipment Recommendations  (pt declining BSC)    Recommendations for Other Services      Precautions / Restrictions Precautions Precautions: Fall Precaution Comments: poor endurance Restrictions Weight Bearing Restrictions: No       Mobility Bed Mobility               General bed mobility comments: pt OOB upon arrival  Transfers Overall transfer level: Needs assistance     Sit to Stand: Min assist         General transfer comment: steadying assist    Balance Overall balance assessment: Needs assistance Sitting-balance support: Feet supported Sitting balance-Leahy Scale: Good       Standing balance-Leahy Scale: Poor                             ADL either performed or assessed with clinical judgement   ADL Overall ADL's : Needs assistance/impaired     Grooming: Oral care;Wash/dry hands;Sitting;Set up Grooming Details (indicate cue type and reason): at sink         Upper Body Dressing : Set up;Sitting Upper Body Dressing Details (indicate cue type and reason): back side gown     Toilet Transfer: Minimal assistance;Stand-pivot   Toileting- Clothing Manipulation and Hygiene: Set up;Sitting/lateral lean         General ADL Comments: Pt did not recall energy conservation strategies from previous session, reeducated.     Vision        Perception     Praxis      Cognition Arousal/Alertness: Awake/alert Behavior During Therapy: Flat affect Overall Cognitive Status: Within Functional Limits for tasks assessed                                          Exercises     Shoulder Instructions       General Comments      Pertinent Vitals/ Pain       Pain Assessment: No/denies pain  Home Living                                          Prior Functioning/Environment              Frequency  Min 2X/week        Progress Toward Goals  OT Goals(current goals can now be found in the care plan section)  Progress towards OT goals: Not progressing toward goals - comment(continues to demonstrate poor endurance)  Acute Rehab OT Goals Patient Stated Goal: to be able to care for himself OT Goal Formulation: With patient Time For Goal Achievement: 11/07/17 Potential to Achieve Goals: Good  Plan Discharge plan remains  appropriate    Co-evaluation                 AM-PAC PT "6 Clicks" Daily Activity     Outcome Measure   Help from another person eating meals?: None Help from another person taking care of personal grooming?: A Little Help from another person toileting, which includes using toliet, bedpan, or urinal?: A Little Help from another person bathing (including washing, rinsing, drying)?: A Little Help from another person to put on and taking off regular upper body clothing?: None Help from another person to put on and taking off regular lower body clothing?: A Little 6 Click Score: 20    End of Session    OT Visit Diagnosis: Unsteadiness on feet (R26.81)   Activity Tolerance Patient limited by fatigue   Patient Left with call bell/phone within reach;in chair   Nurse Communication          Time: [id number] OT Time Calculation (min): 18 min  Charges: OT General Charges $OT Visit: 1 Visit OT Treatments $Self Care/Home Management : 8-22  mins  10/27/2017 Martie RoundJulie Kayleena Eke, OTR/L Pager: [phone]   Iran PlanasMayberry, [place] BailiffJulie Lynn 10/27/2017, 1:45 PM

## 2017-10-27 NOTE — Plan of Care (Signed)
Patient uses call light appropriately as needed for assistance and is aware of how to call on phone for RN/NT using phone numbers on white board, all personal items within reach, VSS and patient's RIJ with dressing C/D/I with CVP's running 9-10 range. Pt is voiding a little more with urine being more clear yellow at this time and edema is minimal, will continue to monit

## 2017-10-27 NOTE — Progress Notes (Signed)
Physical Therapy Treatment Patient Details Name: Edgar PianChristopher [name] MRN: [id number] DOB: 12/29/1960 Today's Date: 10/27/2017    History of Present Illness 56 y/o male with h/o severe HTN, systolic HF with EF 25-30%, DM, CKD 4 admitted with decompensated HF and recent ICD shock. Recovery complicated by cardiorenal syndrome    PT Comments    Pt pleasant with flat affect and able to increase gait distance today. Pt initially required seated rest but VSS and able to perform second gait trial of 150' with slow steady pace. Pt able to recall a few pieces of energy conservation education but unaware of anything other than resting when you get tired and educated for benefit of seated activity, slow progression and walking program. Will continue to follow.     Follow Up Recommendations  Home health PT     Equipment Recommendations  Rolling walker with 5" wheels;3in1 (PT)    Recommendations for Other Services       Precautions / Restrictions Precautions Precautions: Fall Precaution Comments: poor endurance    Mobility  Bed Mobility Overal bed mobility: Modified Independent Bed Mobility: Sit to Supine           General bed mobility comments: pt OOB upon arrival  Transfers Overall transfer level: Needs assistance     Sit to Stand: Supervision         General transfer comment: cues for hand placement  Ambulation/Gait Ambulation/Gait assistance: Supervision Ambulation Distance (Feet): 150 Feet Assistive device: Rolling walker (2 wheeled) Gait Pattern/deviations: Step-through pattern;Decreased stride length;Trunk flexed   Gait velocity interpretation: Below normal speed for age/gender General Gait Details: pt walked 50' with seated rest then an additional 150' with SPo2 94-97% on RA, HR 114-120 and pt stable throughout. Chair to follow but not needed on second bout. Cues for posture and breathing technique   Stairs            Wheelchair Mobility    Modified  Rankin (Stroke Patients Only)       Balance Overall balance assessment: No apparent balance deficits (not formally assessed) Sitting-balance support: Feet supported Sitting balance-Leahy Scale: Good     Standing balance support: Bilateral upper extremity supported Standing balance-Leahy Scale: Fair                              Cognition Arousal/Alertness: Awake/alert Behavior During Therapy: Flat affect Overall Cognitive Status: Within Functional Limits for tasks assessed                                        Exercises General Exercises - Lower Extremity Long Arc Quad: AROM;15 reps;Both;Seated Hip Flexion/Marching: AROM;Both;15 reps;Seated    General Comments        Pertinent Vitals/Pain Pain Assessment: No/denies pain    Home Living                      Prior Function            PT Goals (current goals can now be found in the care plan section) Acute Rehab PT Goals Patient Stated Goal: to be able to care for himself Progress towards PT goals: Progressing toward goals    Frequency           PT Plan Current plan remains appropriate    Co-evaluation  AM-PAC PT "6 Clicks" Daily Activity  Outcome Measure  Difficulty turning over in bed (including adjusting bedclothes, sheets and blankets)?: None Difficulty moving from lying on back to sitting on the side of the bed? : A Little Difficulty sitting down on and standing up from a chair with arms (e.g., wheelchair, bedside commode, etc,.)?: A Little Help needed moving to and from a bed to chair (including a wheelchair)?: A Little Help needed walking in hospital room?: A Little Help needed climbing 3-5 steps with a railing? : A Little 6 Click Score: 19    End of Session   Activity Tolerance: Patient tolerated treatment well Patient left: in bed;with call bell/phone within reach;with nursing/sitter in room Nurse Communication: Mobility status        Time: 6962-9528 PT Time Calculation (min) (ACUTE ONLY): 23 min  Charges:  $Gait Training: 8-22 mins $Therapeutic Exercise: 8-22 mins                    G Codes:       Delaney Meigs, PT [phone]    Bellami Farrelly B Nafeesa Dils 10/27/2017, 2:30 PM

## 2017-10-27 NOTE — Progress Notes (Signed)
S:  Doing well today, denies any complaints.   O:BP (!) 108/45   Pulse (!) 106   Temp 97.8 F (36.6 C) (Axillary)   Resp 19   Ht 5\' 11"  (1.803 m)   Wt 180 lb 6.4 oz (81.8 kg)   SpO2 100%   BMI 25.16 kg/m   Intake/Output Summary (Last 24 hours) at 10/27/2017 0957 Last data filed at 10/27/2017 0915 Gross per 24 hour  Intake 420 ml  Output 1575 ml  Net -1155 ml   Intake/Output: I/O last 3 completed shifts: In: 1003 [P.O.:980; I.V.:23] Out: 2345 [Urine:2345]  Intake/Output this shift:  Total I/O In: -  Out: 350 [Urine:350] Weight change: 2 lb 14.4 oz (1.315 kg)  Gen: pleasant gentleman, laying in bed in NAD NECK: R IJ nontunneled HD cath in place CVS: tachycardia with regular rate, no MRG Resp: normal work of breathing, clear to auscultation bilaterally  Abd: Soft, non tender, +BS Ext: without edema or cyanosis  Recent Labs  Lab 10/21/17 1415  10/24/17 0437 10/24/17 1847 10/25/17 0556 10/25/17 1715 10/26/17 0254 10/26/17 1549 10/27/17 0644  NA  --    < > 129* 129* 131* 131* 131* 130* 132*  K  --    < > 3.5 3.3* 3.4* 3.9 3.7 4.0 3.8  CL  --    < > 93* 94* 94* 96* 96* 96* 94*  CO2  --    < > 24 23 24 23 22 24 23   GLUCOSE  --    < > 82 118* 86 99 85 96 81  BUN  --    < > 46* 48* 49* 52* 54* 59* 60*  CREATININE  --    < > 4.23* 4.24* 4.34* 4.54* 4.49* 4.60* 4.55*  ALBUMIN  --    < > 2.8* 2.8* 2.8* 2.9* 2.9* 2.9* 3.0*  CALCIUM  --    < > 8.8* 8.6* 8.8* 8.7* 8.8* 8.8* 9.1  PHOS  --    < > 5.6* 4.8* 5.5* 5.2* 5.2* 4.9* 5.0*  ALT 28  --   --   --   --   --   --   --   --    < > = values in this interval not displayed.   Liver Function Tests: Recent Labs  Lab 10/21/17 1415  10/26/17 0254 10/26/17 1549 10/27/17 0644  ALT 28  --   --   --   --   ALBUMIN  --    < > 2.9* 2.9* 3.0*   < > = values in this interval not displayed.   No results for input(s): LIPASE, AMYLASE in the last 168 hours. No results for input(s): AMMONIA in the last 168 hours. CBC: Recent  Labs  Lab 10/23/17 1330 10/24/17 0437 10/25/17 0555 10/26/17 0254 10/27/17 0515  WBC 8.8 9.8 8.4 8.9 8.5  HGB 13.2 12.6* 12.3* 12.5* 12.1*  HCT 37.9* 36.8* 35.6* 36.2* 35.8*  MCV 78.3 78.3 78.2 78.9 80.4  PLT 208 227 212 249 272   Cardiac Enzymes: No results for input(s): CKTOTAL, CKMB, CKMBINDEX, TROPONINI in the last 168 hours. CBG: No results for input(s): GLUCAP in the last 168 hours.  Iron Studies: No results for input(s): IRON, TIBC, TRANSFERRIN, FERRITIN in the last 72 hours. Studies/Results: No results found. Marland Kitchen amiodarone  200 mg Oral BID  . aspirin EC  81 mg Oral Daily  . calcitRIOL  0.5 mcg Oral Daily  . Chlorhexidine Gluconate Cloth  6 each Topical Daily  .  furosemide  160 mg Oral TID  . heparin subcutaneous  5,000 Units Subcutaneous Q8H  . polyethylene glycol  17 g Oral Daily  . potassium chloride  60 mEq Oral Daily  . sodium chloride flush  10-40 mL Intracatheter Q12H  . sodium chloride flush  3 mL Intravenous Q12H  . sodium chloride flush  3 mL Intravenous Q12H  . tamsulosin  0.4 mg Oral QPC supper    BMET    Component Value Date/Time   NA 132 (L) 10/27/2017 0644   K 3.8 10/27/2017 0644   CL 94 (L) 10/27/2017 0644   CO2 23 10/27/2017 0644   GLUCOSE 81 10/27/2017 0644   BUN 60 (H) 10/27/2017 0644   CREATININE 4.55 (H) 10/27/2017 0644   CALCIUM 9.1 10/27/2017 0644   GFRNONAA 13 (L) 10/27/2017 0644   GFRAA 15 (L) 10/27/2017 0644   CBC    Component Value Date/Time   WBC 8.5 10/27/2017 0515   RBC 4.45 10/27/2017 0515   HGB 12.1 (L) 10/27/2017 0515   HCT 35.8 (L) 10/27/2017 0515   PLT 272 10/27/2017 0515   MCV 80.4 10/27/2017 0515   MCH 27.2 10/27/2017 0515   MCHC 33.8 10/27/2017 0515   RDW 18.3 (H) 10/27/2017 0515   LYMPHSABS 1.4 10/13/2017 1045   MONOABS 0.6 10/13/2017 1045   EOSABS 0.1 10/13/2017 1045   BASOSABS 0.0 10/13/2017 1045     Assessment/Plan:  1.  AKI with CKD 4: s/p CRRT and had one IHD 10/29. Vein mapping complete.  Creatinine and BUN stable today from yesterday -continue Lasix to 160 TID  - standing K repletion. - No indication for HD today - continue to monitor creatinine, if renal function worsens will resume dialysis, patient may be come HD dependent during this hospital stay.  2. HFrEF: [facility]    3.  Anemia.Currently hemoglobin stable. Feraheme was given on 10/18/2017. -continue monitoring  4.  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 -continue Calcitriol 0.5 per day  5.  Afib- Stable. On amiodarone -continue tele  6.  Hyponatremia: volume related, improving - continue to monitor  Dolores PattyAngela Kevan Prouty, DO PGY-2, [facility] 10/27/2017 9:57 AM

## 2017-10-28 ENCOUNTER — Other Ambulatory Visit: Payer: Self-pay

## 2017-10-28 LAB — RENAL FUNCTION PANEL
ANION GAP: 11 (ref 5–15)
Albumin: 3 g/dL — ABNORMAL LOW (ref 3.5–5.0)
BUN: 60 mg/dL — AB (ref 6–20)
CHLORIDE: 99 mmol/L — AB (ref 101–111)
CO2: 23 mmol/L (ref 22–32)
Calcium: 9.1 mg/dL (ref 8.9–10.3)
Creatinine, Ser: 4.75 mg/dL — ABNORMAL HIGH (ref 0.61–1.24)
GFR calc Af Amer: 14 mL/min — ABNORMAL LOW (ref >60)
GFR calc non Af Amer: 12 mL/min — ABNORMAL LOW (ref >60)
GLUCOSE: 86 mg/dL (ref 65–99)
POTASSIUM: 4.3 mmol/L (ref 3.5–5.1)
Phosphorus: 5.2 mg/dL — ABNORMAL HIGH (ref 2.5–4.6)
Sodium: 133 mmol/L — ABNORMAL LOW (ref 135–145)

## 2017-10-28 LAB — CBC
HCT: 35.2 % — ABNORMAL LOW (ref 39.0–52.0)
HEMOGLOBIN: 12 g/dL — AB (ref 13.0–17.0)
MCH: 27.1 pg (ref 26.0–34.0)
MCHC: 34.1 g/dL (ref 30.0–36.0)
MCV: 79.6 fL (ref 78.0–100.0)
PLATELETS: 251 10*3/uL (ref 150–400)
RBC: 4.42 MIL/uL (ref 4.22–5.81)
RDW: 17.3 % — ABNORMAL HIGH (ref 11.5–15.5)
WBC: 8.2 10*3/uL (ref 4.0–10.5)

## 2017-10-28 LAB — COOXEMETRY PANEL
Carboxyhemoglobin: 1.6 % — ABNORMAL HIGH (ref 0.5–1.5)
METHEMOGLOBIN: 0.8 % (ref 0.0–1.5)
O2 Saturation: 68.6 %
Total hemoglobin: 12.5 g/dL (ref 12.0–16.0)

## 2017-10-28 LAB — MAGNESIUM: Magnesium: 2 mg/dL (ref 1.7–2.4)

## 2017-10-28 LAB — GLUCOSE, CAPILLARY
Glucose-Capillary: 97 mg/dL (ref 65–99)
Glucose-Capillary: 122 mg/dL — ABNORMAL HIGH (ref 65–99)

## 2017-10-28 MED ORDER — TAMSULOSIN HCL 0.4 MG PO CAPS: 0.4000 mg | ORAL_CAPSULE | Freq: Every day | ORAL | 6 refills | Status: AC

## 2017-10-28 MED ORDER — AMIODARONE HCL 200 MG PO TABS: 200.0000 mg | ORAL_TABLET | Freq: Two times a day (BID) | ORAL | 6 refills | Status: AC

## 2017-10-28 MED ORDER — FUROSEMIDE 80 MG PO TABS: 160.0000 mg | ORAL_TABLET | Freq: Three times a day (TID) | ORAL | 6 refills | Status: AC

## 2017-10-28 NOTE — Progress Notes (Signed)
Physical Therapy Treatment Patient Details Name: Edgar [name] MRN: [id number] DOB: 12/13/1961 Today's Date: 10/28/2017    History of Present Illness [age] y/o male with h/o severe HTN, systolic HF with EF 25-30%, DM, CKD 4 admitted with decompensated HF and recent ICD shock. Recovery complicated by cardiorenal syndrome    PT Comments    Progressing well.  EHR staying flat, but climbed to as high as  125 bpm.  Pt showing fatigue by climbing RR to over 30 rpm and by mild clumbsiness R LE  Follow Up Recommendations  Home health PT     Equipment Recommendations  Rolling walker with 5" wheels;3in1 (PT)    Recommendations for Other Services       Precautions / Restrictions Precautions Precautions: Fall Precaution Comments: poor endurance    Mobility  Bed Mobility Overal bed mobility: Modified Independent                Transfers Overall transfer level: Needs assistance Equipment used: Rolling walker (2 wheeled);None Transfers: Sit to/from Stand Sit to Stand: Supervision         General transfer comment: cues for hand placement  Ambulation/Gait Ambulation/Gait assistance: Supervision Ambulation Distance (Feet): 350 Feet Assistive device: Rolling walker (2 wheeled) Gait Pattern/deviations: Step-through pattern;Decreased stride length;Trunk flexed Gait velocity: decreased Gait velocity interpretation: Below normal speed for age/gender General Gait Details: Generally steady for most of the gait trial with some degradation in R LE once fatigued.  HR during gait 118-125 with Sats at 93-98% and RR generally b/w 20 and 30   Stairs            Wheelchair Mobility    Modified Rankin (Stroke Patients Only)       Balance Overall balance assessment: Needs assistance   Sitting balance-Leahy Scale: Good       Standing balance-Leahy Scale: Fair Standing balance comment: deviations/self recoverable episodes of instability                             Cognition Arousal/Alertness: Awake/alert Behavior During Therapy: Flat affect Overall Cognitive Status: Within Functional Limits for tasks assessed                                        Exercises General Exercises - Lower Extremity Hip ABduction/ADduction: AROM;Both;10 reps;Standing Hip Flexion/Marching: AROM;Strengthening;Both;10 reps;Standing Heel Raises: AROM;AAROM;Both;10 reps;Standing    General Comments        Pertinent Vitals/Pain Pain Assessment: No/denies pain    Home Living                      Prior Function            PT Goals (current goals can now be found in the care plan section) Acute Rehab PT Goals Patient Stated Goal: to be able to care for himself PT Goal Formulation: With patient Time For Goal Achievement: 11/05/17 Potential to Achieve Goals: Good Progress towards PT goals: Progressing toward goals    Frequency    Min 3X/week      PT Plan Current plan remains appropriate    Co-evaluation              AM-PAC PT "6 Clicks" Daily Activity  Outcome Measure  Difficulty turning over in bed (including adjusting bedclothes, sheets and blankets)?: None Difficulty moving from lying on back to sitting  on the side of the bed? : A Little Difficulty sitting down on and standing up from a chair with arms (e.g., wheelchair, bedside commode, etc,.)?: A Little Help needed moving to and from a bed to chair (including a wheelchair)?: A Little Help needed walking in hospital room?: A Little Help needed climbing 3-5 steps with a railing? : A Little 6 Click Score: 19    End of Session   Activity Tolerance: Patient tolerated treatment well Patient left: in bed;with call bell/phone within reach Nurse Communication: Mobility status PT Visit Diagnosis: Other abnormalities of gait and mobility (R26.89);Unsteadiness on feet (R26.81)     Time: [id number] PT Time Calculation (min) (ACUTE ONLY): 24 min  Charges:  $Gait  Training: 8-22 mins $Therapeutic Exercise: 8-22 mins                    G Codes:       10/28/2017  [place] BingKen Kijana Cromie, PT [phone]  (pager)    Edgar [name] 10/28/2017, 10:00 AM

## 2017-10-28 NOTE — Progress Notes (Signed)
S:  Doing well today, denies any complaints.   O:BP (!) 121/102 (BP Location: Left Arm)   Pulse (!) 112   Temp 97.6 F (36.4 C) (Axillary)   Resp 20   Ht 5\' 11"  (1.803 m)   Wt 179 lb 8 oz (81.4 kg)   SpO2 100%   BMI 25.04 kg/m   Intake/Output Summary (Last 24 hours) at 10/28/2017 1033 Last data filed at 10/28/2017 0400 Gross per 24 hour  Intake 400 ml  Output 1000 ml  Net -600 ml   Intake/Output: I/O last 3 completed shifts: In: 820 [P.O.:820] Out: 2460 [Urine:2460]  Intake/Output this shift:  No intake/output data recorded. Weight change: -14.4 oz (-0.408 kg)  Gen: pleasant gentleman, laying in bed in NAD NECK: R IJ nontunneled HD cath in place CVS: tachycardia with regular rate, no MRG Resp: normal work of breathing, clear to auscultation bilaterally  Abd: Soft, non tender, +BS Ext: without edema or cyanosis  Recent Labs  Lab 10/21/17 1415  10/25/17 0556 10/25/17 1715 10/26/17 0254 10/26/17 1549 10/27/17 0644 10/27/17 1808 10/28/17 0430  NA  --    < > 131* 131* 131* 130* 132* 131* 133*  K  --    < > 3.4* 3.9 3.7 4.0 3.8 4.5 4.3  CL  --    < > 94* 96* 96* 96* 94* 99* 99*  CO2  --    < > 24 23 22 24 23 22 23   GLUCOSE  --    < > 86 99 85 96 81 101* 86  BUN  --    < > 49* 52* 54* 59* 60* 61* 60*  CREATININE  --    < > 4.34* 4.54* 4.49* 4.60* 4.55* 4.69* 4.75*  ALBUMIN  --    < > 2.8* 2.9* 2.9* 2.9* 3.0* 3.0* 3.0*  CALCIUM  --    < > 8.8* 8.7* 8.8* 8.8* 9.1 9.0 9.1  PHOS  --    < > 5.5* 5.2* 5.2* 4.9* 5.0* 4.9* 5.2*  ALT 28  --   --   --   --   --   --   --   --    < > = values in this interval not displayed.   Liver Function Tests: Recent Labs  Lab 10/21/17 1415  10/27/17 0644 10/27/17 1808 10/28/17 0430  ALT 28  --   --   --   --   ALBUMIN  --    < > 3.0* 3.0* 3.0*   < > = values in this interval not displayed.   No results for input(s): LIPASE, AMYLASE in the last 168 hours. No results for input(s): AMMONIA in the last 168 hours. CBC: Recent Labs   Lab 10/24/17 0437 10/25/17 0555 10/26/17 0254 10/27/17 0515 10/28/17 0430  WBC 9.8 8.4 8.9 8.5 8.2  HGB 12.6* 12.3* 12.5* 12.1* 12.0*  HCT 36.8* 35.6* 36.2* 35.8* 35.2*  MCV 78.3 78.2 78.9 80.4 79.6  PLT 227 212 249 272 251   Cardiac Enzymes: No results for input(s): CKTOTAL, CKMB, CKMBINDEX, TROPONINI in the last 168 hours. CBG: Recent Labs  Lab 10/28/17 0823  GLUCAP 97    Iron Studies: No results for input(s): IRON, TIBC, TRANSFERRIN, FERRITIN in the last 72 hours. Studies/Results: No results found. Marland Kitchen. amiodarone  200 mg Oral BID  . aspirin EC  81 mg Oral Daily  . calcitRIOL  0.5 mcg Oral Daily  . Chlorhexidine Gluconate Cloth  6 each Topical Daily  . furosemide  160 mg Oral TID  . heparin subcutaneous  5,000 Units Subcutaneous Q8H  . polyethylene glycol  17 g Oral Daily  . sodium chloride flush  10-40 mL Intracatheter Q12H  . sodium chloride flush  3 mL Intravenous Q12H  . sodium chloride flush  3 mL Intravenous Q12H  . tamsulosin  0.4 mg Oral QPC supper    BMET    Component Value Date/Time   NA 133 (L) 10/28/2017 0430   K 4.3 10/28/2017 0430   CL 99 (L) 10/28/2017 0430   CO2 23 10/28/2017 0430   GLUCOSE 86 10/28/2017 0430   BUN 60 (H) 10/28/2017 0430   CREATININE 4.75 (H) 10/28/2017 0430   CALCIUM 9.1 10/28/2017 0430   GFRNONAA 12 (L) 10/28/2017 0430   GFRAA 14 (L) 10/28/2017 0430   CBC    Component Value Date/Time   WBC 8.2 10/28/2017 0430   RBC 4.42 10/28/2017 0430   HGB 12.0 (L) 10/28/2017 0430   HCT 35.2 (L) 10/28/2017 0430   PLT 251 10/28/2017 0430   MCV 79.6 10/28/2017 0430   MCH 27.1 10/28/2017 0430   MCHC 34.1 10/28/2017 0430   RDW 17.3 (H) 10/28/2017 0430   LYMPHSABS 1.4 10/13/2017 1045   MONOABS 0.6 10/13/2017 1045   EOSABS 0.1 10/13/2017 1045   BASOSABS 0.0 10/13/2017 1045     Assessment/Plan:  1.  AKI with CKD 4: s/p CRRT and had one IHD 10/29. Vein mapping complete. Creatinine slowly increasing, BUN stable today from  yesterday - continue Lasix to 160 TID upon discharge - remove IJ cath - can d/c home with close follow up with Dr. Hyman HopesWebb - discussed fluid restriction, close weight monitoring at home with patient  2. HFrEF: [facility]  3.  Anemia.Currently hemoglobin stable. Feraheme was given on 10/18/2017.  4.  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 -continue Calcitriol 0.5 per day  5.  Afib- Stable. On amiodarone  6.  Hyponatremia: volume related, improving  Dispo: discharge home today  Dolores PattyAngela Neisha Hinger, DO PGY-2, [facility] 10/28/2017 10:33 AM

## 2017-10-28 NOTE — Discharge Summary (Signed)
Advanced Heart Failure Team  Discharge Summary   Patient ID: Edgar [name] MRN: [id number], DOB/AGE: Jan 18, 1961 56 y.o. Admit date: 10/13/2017 D/C date:     10/28/2017   Primary Discharge Diagnoses:  1. A/C Systolic Heart Failure  2. VT/VF No driving 6 months  3. Elevated Troponin 4. A/C Renal Failure Stage IV 5. HTN 6. Severe Edgar 7. H/O CVA 8. Deconditioning   [name] Course:  Edgar [name] is a 57 y/o male with h/o severe HTN, systolic HF with EF 25-30%, DM, embolic stroke 2014, CKD 4 admitted with decompensated HF and and numerous episodes of VT.   Admitted with marked volume overload and started on lasix drip. Due to worsening renal function and marked volume overload he was take to the cath lab for swan ganz catheter placement. RHC showed low output and elevated filling pressures. Milrinone was added and he continued to diurese with high dose IV lasix. Renal function continued to worsen so Neprology was consulted. He placed short term CVVHD and as he improved this was stopped and he transitioned to Edgar [name]. Last iHD was on 10/30.  Co-ox remained stable off milrinone. Diuretics were titrated by Renal to lasix 160 po TID. Creatinine and CVP climbed slowly but asymptomatic. D/w Dr. Hyman Hopes and felt stable for d/c on 10/28/17. No ACE/ARB due to CKD. No b-blocker due to low output. Hydraalzine stopped due to low BP on HD.   Multiple discussions had about possibility of referral to [facility]kidney [facility] which will be done as outpatient.   Seen by PT and recommended HHPT.   He will be followed closely by Nephrology and the HF Team. Edgar [name] to follow for HHRN/PT/OT.     Discharge Weight: 179 pounds  Discharge Vitals: Blood pressure (!) 121/102, pulse (!) 112, temperature 97.6 F (36.4 C), temperature source Axillary, resp. rate 20, height 5\' 11"  (1.803 m), weight 179 lb 8 oz (81.4 kg), SpO2 100 %.  Labs: Lab Results  Component Value Date   WBC 8.2 10/28/2017   HGB 12.0 (L)  10/28/2017   HCT 35.2 (L) 10/28/2017   MCV 79.6 10/28/2017   PLT 251 10/28/2017    Recent Labs  Lab 10/21/17 1415  10/28/17 0430  NA  --    < > 133*  K  --    < > 4.3  CL  --    < > 99*  CO2  --    < > 23  BUN  --    < > 60*  CREATININE  --    < > 4.75*  CALCIUM  --    < > 9.1  ALT 28  --   --   GLUCOSE  --    < > 86   < > = values in this interval not displayed.   No results found for: CHOL, HDL, LDLCALC, TRIG BNP (last 3 results) Recent Labs    10/13/17 1045  BNP 3,149.4*    ProBNP (last 3 results) No results for input(s): PROBNP in the last 8760 hours.   Diagnostic Studies/Procedures   No results found.  Discharge Medications   Allergies as of 10/28/2017   No Known Allergies     Medication List    STOP taking these medications   amLODipine 5 MG tablet Commonly known as:  NORVASC   carvedilol 12.5 MG tablet Commonly known as:  COREG   hydrALAZINE 25 MG tablet Commonly known as:  APRESOLINE   isosorbide mononitrate 30 MG 24 hr tablet Commonly known  as:  IMDUR   potassium chloride SA 15 MEQ tablet Commonly known as:  KLOR-CON M15     TAKE these medications   amiodarone 200 MG tablet Commonly known as:  PACERONE Take 1 tablet (200 mg total) 2 (two) times daily by mouth.   ASPIRIN EC LO-DOSE 81 MG EC tablet Generic drug:  aspirin Take 81 mg by mouth daily.   furosemide 80 MG tablet Commonly known as:  LASIX Take 2 tablets (160 mg total) 3 (three) times daily by mouth. What changed:    medication strength  how much to take  when to take this   guaiFENesin 100 MG/5ML Soln Commonly known as:  ROBITUSSIN Take 5 mLs (100 mg total) by mouth every 4 (four) hours as needed for cough or to loosen phlegm.   tamsulosin 0.4 MG Caps capsule Commonly known as:  FLOMAX Take 1 capsule (0.4 mg total) daily after supper by mouth. What changed:  when to take this            Durable Medical Equipment  (From admission, onward)        Start      Ordered   10/23/17 1215  For home use only DME Walker rolling  Once    Comments:  5 inch wheels  Question:  Patient needs a walker to treat with the following condition  Answer:  Physical deconditioning   10/23/17 1214   10/23/17 1215  For home use only DME 3 n 1  Once     10/23/17 1214   10/23/17 1213  Heart failure home health orders  (Heart failure home health orders / Face to face)  Once    Comments:  Heart Failure Follow-up Care:  Verify follow-up appointments per Patient Discharge Instructions. Confirm transportation arranged. Reconcile home medications with discharge medication list. Remove discontinued medications from use. Assist patient/caregiver to manage medications using pill box. Reinforce low sodium food selection Assessments: Vital signs and oxygen saturation at each visit. Assess home environment for safety concerns, caregiver support and availability of low-sodium foods. Consult Child psychotherapistocial Worker, PT/OT, Dietitian, and CNA based on assessments. Perform comprehensive cardiopulmonary assessment. Notify Edgar [name] for any change in condition or weight gain of 3 pounds in one day or 5 pounds in one week with symptoms. Daily Weights and Symptom Monitoring: Ensure patient has access to scales. Teach patient/caregiver to weigh daily before breakfast and after voiding using same scale and record.    Teach patient/caregiver to track weight and symptoms and when to notify Provider. Activity: Develop individualized activity plan with patient/caregiver.   Question Answer Comment  Heart Failure Follow-up Care Or per Doctor (see comments)   Obtain the following labs Basic Metabolic Panel   Obtain the following labs Other see comments   Lab frequency Other see comments   Fax lab results to Other see comments   Diet Low Sodium Heart Healthy   Fluid restrictions: 1800 mL Fluid   Initiate Heart Failure Clinic Diuretic Protocol to be used by [facility] only ( to be ordered by  Heart Failure Team Providers Only) Yes      10/23/17 1213   10/23/17 0806  Heart failure home health orders  (Heart failure home health orders / Face to face)  Once    Comments:  Heart Failure Follow-up Care:  Verify follow-up appointments per Patient Discharge Instructions. Confirm transportation arranged. Reconcile home medications with discharge medication list. Remove discontinued medications from use. Assist patient/caregiver to manage medications using pill box.  Reinforce low sodium food selection Assessments: Vital signs and oxygen saturation at each visit. Assess home environment for safety concerns, caregiver support and availability of low-sodium foods. Consult Child psychotherapistocial Worker, PT/OT, Dietitian, and CNA based on assessments. Perform comprehensive cardiopulmonary assessment. Notify Edgar [name] for any change in condition or weight gain of 3 pounds in one day or 5 pounds in one week with symptoms. Daily Weights and Symptom Monitoring: Ensure patient has access to scales. Teach patient/caregiver to weigh daily before breakfast and after voiding using same scale and record.    Teach patient/caregiver to track weight and symptoms and when to notify Provider. Activity: Develop individualized activity plan with patient/caregiver.   Question Answer Comment  Heart Failure Follow-up Care Or per Doctor (see comments)   Obtain the following labs Basic Metabolic Panel   Obtain the following labs Other see comments   Lab frequency Other see comments   Fax lab results to Other see comments   Diet Low Sodium Heart Healthy   Fluid restrictions: 2000 mL Fluid   Initiate Heart Failure Clinic Diuretic Protocol to be used by [facility] only ( to be ordered by Heart Failure Team Providers Only) Yes      10/23/17 0808      Disposition   The patient will be discharged in stable condition to home. Discharge Instructions    (HEART FAILURE PATIENTS) Call Edgar [name]:  Anytime you have any of the  following symptoms: 1) 3 pound weight gain in 24 hours or 5 pounds in 1 week 2) shortness of breath, with or without a dry hacking cough 3) swelling in the hands, feet or stomach 4) if you have to sleep on extra pillows at night in order to breathe.   Complete by:  As directed    Diet - low sodium heart healthy   Complete by:  As directed    Heart Failure patients record your daily weight using the same scale at the same time of day   Complete by:  As directed    Increase activity slowly   Complete by:  As directed      Follow-up Information    [facility].   Why:  [facility] will deliver your rolling walker and will contact you within 1-2 days after discharge to start care with a Registered Nurse and a Physical Therapist.   Contact information: [address] [place] [address] [phone]        Edgar [name], Martin, Edgar [name] Follow up on 11/10/2017.   Specialty:  Nephrology Why:  at 9:30  Contact information: [address] [place] [address] [phone]        Edgar [name], Edgar Bucklesaniel [name], Edgar [name]. Go on 11/05/2017.   Specialty:  Cardiology Why:  3:00 pm, [facility], parking code [id number] Contact information: [address] [place] [address] [phone]             Duration of Discharge Encounter: Greater than 35 minutes   Signed, Tonye Becketmy Clegg NP-C  10/28/2017, 10:03 AM   Patient seen and examined with Tonye BecketAmy Clegg, NP. We discussed all aspects of the encounter. I agree with the assessment and plan as stated above.   Agree with above. Will need close f/u in HF and Nephrology clinic.   Arvilla MeresBensimhon, Aiysha Jillson, Edgar [name]  10:06 PM

## 2017-10-28 NOTE — Progress Notes (Signed)
Advanced Heart Failure Rounding Note   Subjective:   Continues to diurese with high dose po lasix. Creatinine up to 4.7.   Feeling ok. Denies SOB.   CVP 12.   RHC 10/16/2017  RA = 20 RV = 53/23 PA = 54/26 (39) PCW = 28 v =35 Fick cardiac output/index = 3.6/1.7 Thermo CO/CI = 7.8/3.6 PVR =1.8 WU SVR = 2022 Ao sat = 99% PA sat = 56%, 56% SVC sat 66%  Objective:     Vital Signs:   Temp:  [97.4 F (36.3 C)-98.5 F (36.9 C)] 97.6 F (36.4 C) (11/06 0513) Pulse Rate:  [110-120] 112 (11/06 0513) Resp:  [18-33] 20 (11/06 0513) BP: (106-121)/(85-102) 121/102 (11/06 0513) SpO2:  [96 %-100 %] 100 % (11/06 0513) Weight:  [179 lb 8 oz (81.4 kg)] 179 lb 8 oz (81.4 kg) (11/06 0513) Last BM Date: 10/26/17  Weight change: Filed Weights   10/26/17 0449 10/27/17 0436 10/28/17 0513  Weight: 177 lb 8 oz (80.5 kg) 180 lb 6.4 oz (81.8 kg) 179 lb 8 oz (81.4 kg)    Intake/Output:   Intake/Output Summary (Last 24 hours) at 10/28/2017 0753 Last data filed at 10/28/2017 0400 Gross per 24 hour  Intake 760 ml  Output 1460 ml  Net -700 ml     Physical Exam: CVP 12-13  General:  Appears fatigued.  No resp difficulty HEENT: normal Neck: supple. JVP 10-11. Carotids 2+ bilat; no bruits. No lymphadenopathy or thryomegaly appreciated. RIJ trialysis catheter Cor: PMI laterally displaced. Regular rate & rhythm. No rubs, or murmurs. + S3 Lungs: clear Abdomen: soft, nontender, nondistended. No hepatosplenomegaly. No bruits or masses. Good bowel sounds. Extremities: no cyanosis, clubbing, rash, edema Neuro: alert & orientedx3, cranial nerves grossly intact. moves all 4 extremities w/o difficulty. Affect pleasant   Telemetry: Sinus Tach 100s Personally reviewed.   Labs: Basic Metabolic Panel: Recent Labs  Lab 10/24/17 0437  10/25/17 0556  10/26/17 0254 10/26/17 1549 10/27/17 0644 10/27/17 1808 10/28/17 0430  NA 129*   < > 131*   < > 131* 130* 132* 131* 133*  K 3.5   < > 3.4*    < > 3.7 4.0 3.8 4.5 4.3  CL 93*   < > 94*   < > 96* 96* 94* 99* 99*  CO2 24   < > 24   < > 22 24 23 22 23   GLUCOSE 82   < > 86   < > 85 96 81 101* 86  BUN 46*   < > 49*   < > 54* 59* 60* 61* 60*  CREATININE 4.23*   < > 4.34*   < > 4.49* 4.60* 4.55* 4.69* 4.75*  CALCIUM 8.8*   < > 8.8*   < > 8.8* 8.8* 9.1 9.0 9.1  MG 1.9  --  2.2  --  2.1  --  2.1  --  2.0  PHOS 5.6*   < > 5.5*   < > 5.2* 4.9* 5.0* 4.9* 5.2*   < > = values in this interval not displayed.    Liver Function Tests: Recent Labs  Lab 10/21/17 1415  10/26/17 0254 10/26/17 1549 10/27/17 0644 10/27/17 1808 10/28/17 0430  ALT 28  --   --   --   --   --   --   ALBUMIN  --    < > 2.9* 2.9* 3.0* 3.0* 3.0*   < > = values in this interval not displayed.   No results  for input(s): LIPASE, AMYLASE in the last 168 hours. No results for input(s): AMMONIA in the last 168 hours.  CBC: Recent Labs  Lab 10/24/17 0437 10/25/17 0555 10/26/17 0254 10/27/17 0515 10/28/17 0430  WBC 9.8 8.4 8.9 8.5 8.2  HGB 12.6* 12.3* 12.5* 12.1* 12.0*  HCT 36.8* 35.6* 36.2* 35.8* 35.2*  MCV 78.3 78.2 78.9 80.4 79.6  PLT 227 212 249 272 251    Cardiac Enzymes: No results for input(s): CKTOTAL, CKMB, CKMBINDEX, TROPONINI in the last 168 hours.  BNP: BNP (last 3 results) Recent Labs    10/13/17 1045  BNP 3,149.4*    ProBNP (last 3 results) No results for input(s): PROBNP in the last 8760 hours.    Other results:  Imaging: No results found.   Medications:     Scheduled Medications: . amiodarone  200 mg Oral BID  . aspirin EC  81 mg Oral Daily  . calcitRIOL  0.5 mcg Oral Daily  . Chlorhexidine Gluconate Cloth  6 each Topical Daily  . furosemide  160 mg Oral TID  . heparin subcutaneous  5,000 Units Subcutaneous Q8H  . polyethylene glycol  17 g Oral Daily  . sodium chloride flush  10-40 mL Intracatheter Q12H  . sodium chloride flush  3 mL Intravenous Q12H  . sodium chloride flush  3 mL Intravenous Q12H  . tamsulosin  0.4  mg Oral QPC supper    Infusions: . sodium chloride    . sodium chloride Stopped (10/20/17 0700)  . sodium chloride 100 mL (10/23/17 0700)  . sodium chloride      PRN Medications: sodium chloride, sodium chloride, sodium chloride, sodium chloride, acetaminophen, alteplase, heparin, heparin, heparin, lidocaine (PF), lidocaine-prilocaine, ondansetron (ZOFRAN) IV, pentafluoroprop-tetrafluoroeth, sodium chloride flush, sodium chloride flush, sodium chloride flush, sorbitol   Patient Profile   56 y/o male with h/o severe HTN, systolic HF with EF 25-30%, DM, CKD 4 admitted with decompensated HF and recent ICD shock.   Assessment/Plan   1. Acute on chronic systolic HF: NICM, cath 09/2016 no CAD. Echo 10/15/17 EF 20% grade II DD, moderate MR. Admitted with volume overload, VT/VF with ICD shock. Required CVVHD and inotropes.  -CVP trending up 12-13. Volume status trending up.  -Continue current dose of lasix.  - No beta blocker. Todays CO-OX is 69%. Stable   - No ARB/ACE/ARNI or Cleda DaubSpiro with renal disease.  - Dr Gala RomneyBensimhon [facility]. If we can get out of hospital with or without HD they will see as outpatient for heart/kidney transplant w/u. If unable to tolerate HD or needs inotropes in house will transfer as inpatient.  2. VT/VF - Quiescent Continue Amio po.  -Mag 2 - K 4.3     Discussed with PharmD personally.  3. Elevated troponin - Due to HF, not ACS.  - No CP.    4. Acute on chronic renal failure stage 4 - Followed by Nephrology - Tolerated iHD 10/21/17 - Creatinine stabilizing at 4.4-4.5. Making ~1.4  liters urine.  - Todays creatinine trending up 4.7 . Vein mapping done. Await decision on access placement and need for iHD per Renal   5. HTN - Stable. If no HD will need to add low dose hydralazine.   6. Severe mitral regurgitation - likely functional MR. - Moderate MR on repeat Echo.  - No change.   7. Previous CVA - mild R-sided  deficits.  - Continue PT/OT.   8. Deconditioning- PT recommending HH.   Disposition: [facility] ordered HHRN/PT  and Rolling walker + 3N1.   Length of Stay: 15  Amy Clegg, NP  10/28/2017, 7:53 AM  Advanced Heart Failure Team Pager [phone] (M-F; 7a - 4p)  Please contact [facility] for night-coverage after hours (4p -7a ) and weekends on [email or web address]  Patient seen and examined with Tonye Becket, NP. We discussed all aspects of the encounter. I agree with the assessment and plan as stated above.   Remains very tenuous. Volume status up still. Creatinine climbing slowly. D/w Dr. Hyman Hopes at bedside. Will plan removal of trialysis catheter today and d/c home with close f/u with HF Clinic and Renal.  Suspect he will need HD soon.   Arvilla Meres, MD  9:53 AM

## 2017-10-31 DIAGNOSIS — I130 Hypertensive heart and chronic kidney disease with heart failure and stage 1 through stage 4 chronic kidney disease, or unspecified chronic kidney disease: Secondary | ICD-10-CM | POA: Diagnosis not present

## 2017-11-05 ENCOUNTER — Encounter (HOSPITAL_COMMUNITY): Payer: Self-pay | Admitting: Internal Medicine

## 2017-11-05 ENCOUNTER — Other Ambulatory Visit: Payer: Self-pay

## 2017-11-05 ENCOUNTER — Ambulatory Visit (HOSPITAL_COMMUNITY)
Admit: 2017-11-05 | Discharge: 2017-11-05 | Disposition: A | Discharge status: Home / Self Care | Payer: Medicare Other | Attending: Internal Medicine | Admitting: Internal Medicine

## 2017-11-05 VITALS — BP 126/83 | HR 101 | Wt 177.2 lb

## 2017-11-05 DIAGNOSIS — N179 Acute kidney failure, unspecified: Secondary | ICD-10-CM | POA: Insufficient documentation

## 2017-11-05 DIAGNOSIS — Z8673 Personal history of transient ischemic attack (TIA), and cerebral infarction without residual deficits: Secondary | ICD-10-CM | POA: Diagnosis not present

## 2017-11-05 DIAGNOSIS — Z841 Family history of disorders of kidney and ureter: Secondary | ICD-10-CM | POA: Diagnosis not present

## 2017-11-05 DIAGNOSIS — I429 Cardiomyopathy, unspecified: Secondary | ICD-10-CM | POA: Diagnosis not present

## 2017-11-05 DIAGNOSIS — I130 Hypertensive heart and chronic kidney disease with heart failure and stage 1 through stage 4 chronic kidney disease, or unspecified chronic kidney disease: Secondary | ICD-10-CM | POA: Insufficient documentation

## 2017-11-05 DIAGNOSIS — Z95810 Presence of automatic (implantable) cardiac defibrillator: Secondary | ICD-10-CM | POA: Diagnosis not present

## 2017-11-05 DIAGNOSIS — Z8249 Family history of ischemic heart disease and other diseases of the circulatory system: Secondary | ICD-10-CM | POA: Insufficient documentation

## 2017-11-05 DIAGNOSIS — I340 Nonrheumatic mitral (valve) insufficiency: Secondary | ICD-10-CM | POA: Insufficient documentation

## 2017-11-05 DIAGNOSIS — Z79899 Other long term (current) drug therapy: Secondary | ICD-10-CM | POA: Insufficient documentation

## 2017-11-05 DIAGNOSIS — I472 Ventricular tachycardia, unspecified: Secondary | ICD-10-CM

## 2017-11-05 DIAGNOSIS — I5022 Chronic systolic (congestive) heart failure: Secondary | ICD-10-CM | POA: Diagnosis not present

## 2017-11-05 DIAGNOSIS — N184 Chronic kidney disease, stage 4 (severe): Secondary | ICD-10-CM | POA: Diagnosis not present

## 2017-11-05 DIAGNOSIS — R55 Syncope and collapse: Secondary | ICD-10-CM | POA: Diagnosis not present

## 2017-11-05 LAB — COMPREHENSIVE METABOLIC PANEL
ALK PHOS: 62 U/L (ref 38–126)
ALT: 14 U/L — ABNORMAL LOW (ref 17–63)
ANION GAP: 16 — AB (ref 5–15)
AST: 20 U/L (ref 15–41)
Albumin: 3.2 g/dL — ABNORMAL LOW (ref 3.5–5.0)
BILIRUBIN TOTAL: 1.6 mg/dL — AB (ref 0.3–1.2)
BUN: 46 mg/dL — ABNORMAL HIGH (ref 6–20)
CALCIUM: 8.9 mg/dL (ref 8.9–10.3)
CO2: 21 mmol/L — AB (ref 22–32)
Chloride: 95 mmol/L — ABNORMAL LOW (ref 101–111)
Creatinine, Ser: 4.86 mg/dL — ABNORMAL HIGH (ref 0.61–1.24)
GFR, EST AFRICAN AMERICAN: 14 mL/min — AB (ref >60)
GFR, EST NON AFRICAN AMERICAN: 12 mL/min — AB (ref >60)
Glucose, Bld: 107 mg/dL — ABNORMAL HIGH (ref 65–99)
Potassium: 3.2 mmol/L — ABNORMAL LOW (ref 3.5–5.1)
SODIUM: 132 mmol/L — AB (ref 135–145)
TOTAL PROTEIN: 8.3 g/dL — AB (ref 6.5–8.1)

## 2017-11-05 LAB — CBC
HCT: 40.1 % (ref 39.0–52.0)
Hemoglobin: 13.6 g/dL (ref 13.0–17.0)
MCH: 27.5 pg (ref 26.0–34.0)
MCHC: 33.9 g/dL (ref 30.0–36.0)
MCV: 81.2 fL (ref 78.0–100.0)
PLATELETS: 316 10*3/uL (ref 150–400)
RBC: 4.94 MIL/uL (ref 4.22–5.81)
RDW: 18.5 % — AB (ref 11.5–15.5)
WBC: 11.1 10*3/uL — AB (ref 4.0–10.5)
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
## 2017-11-05 NOTE — Patient Instructions (Signed)
Labs drawn today (if we do not call you, then your lab work was stable)    Your physician recommends that you schedule a follow-up appointment in: 4 weeks with Dr. Gala RomneyBensimhon

## 2017-11-06 ENCOUNTER — Encounter: Payer: Self-pay | Admitting: *Deleted

## 2017-11-06 NOTE — Progress Notes (Signed)
[facility]. Dr. Gala RomneyBensimhon spoke with Dr. Edwena BloweVore re: this patient. Asked for transplant evaluation to be done ASAP.

## 2017-11-07 ENCOUNTER — Telehealth (HOSPITAL_COMMUNITY): Payer: Self-pay

## 2017-11-07 NOTE — Telephone Encounter (Signed)
Result Notes for Comprehensive Metabolic Panel (CMET)   Notes recorded by Chyrl CivatteBradley, Megan G, RN on 11/07/2017 at 9:39 AM EST Attempted to call, phone rings once and goes to a message stating VM is not set up yet. ------  Notes recorded by Noralee SpaceSchub, Heather M, RN on 11/06/2017 at 5:25 PM EST Attempted to call pt, no answer and VM is not set up ------  Notes recorded by Bensimhon, Bevelyn Bucklesaniel R, MD on 11/05/2017 at 9:39 PM EST Heather please have him take extra kcl 40 x 1.  Daphine DeutscherMartin - this is our mutual patient. He sees you next week. No volume overload or overt uremia on exam today. But very weak and appetite sluggish. Thanks -dan

## 2017-11-07 NOTE — Telephone Encounter (Signed)
Result Notes for Comprehensive Metabolic Panel (CMET)   Notes recorded by Chyrl CivatteBradley, Malakye Nolden G, RN on 11/07/2017 at 3:02 PM EST Attempted to call, phone rings once and goes to a message stating VM is not set up yet. ------  Notes recorded by Chyrl CivatteBradley, Roshon Duell G, RN on 11/07/2017 at 9:39 AM EST Attempted to call, phone rings once and goes to a message stating VM is not set up yet. ------  Notes recorded by Noralee SpaceSchub, Heather M, RN on 11/06/2017 at 5:25 PM EST Attempted to call pt, no answer and VM is not set up ------  Notes recorded by Bensimhon, Bevelyn Bucklesaniel R, MD on 11/05/2017 at 9:39 PM EST Heather please have him take extra kcl 40 x 1.  Daphine DeutscherMartin - this is our mutual patient. He sees you next week. No volume overload or overt uremia on exam today. But very weak and appetite sluggish. Thanks -dan

## 2017-11-11 ENCOUNTER — Encounter (HOSPITAL_COMMUNITY): Payer: Self-pay

## 2017-11-19 ENCOUNTER — Telehealth (HOSPITAL_COMMUNITY): Payer: Self-pay | Admitting: *Deleted

## 2017-11-19 NOTE — Telephone Encounter (Signed)
Physical therapist with [facility] called to report that patient refused PT and will only continue with Skilled Nursing.

## 2017-11-24 ENCOUNTER — Encounter (HOSPITAL_COMMUNITY): Payer: Non-veteran care

## 2017-11-24 ENCOUNTER — Other Ambulatory Visit (HOSPITAL_COMMUNITY): Payer: Self-pay | Admitting: *Deleted

## 2017-11-25 ENCOUNTER — Ambulatory Visit (HOSPITAL_COMMUNITY)
Admission: RE | Admit: 2017-11-25 | Discharge: 2017-11-25 | Disposition: A | Discharge status: Home / Self Care | Payer: Non-veteran care | Source: Ambulatory Visit | Attending: Nephrology | Admitting: Nephrology

## 2017-11-27 ENCOUNTER — Telehealth (HOSPITAL_COMMUNITY): Payer: Self-pay | Admitting: *Deleted

## 2017-11-27 ENCOUNTER — Encounter (HOSPITAL_COMMUNITY): Payer: Self-pay | Admitting: *Deleted

## 2017-11-27 NOTE — Telephone Encounter (Signed)
Contacted Edgar CrockerNadia Batts, Heart Program Specialist at [facility] and confirmed transplant evaluation packet received, pt had appointment on 11/24/17. Transplant team was notified and financial clearance has been obtained for Heart/Kidney transplant. [name] will contact patient with transplant evaluation schedule.

## 2017-12-02 ENCOUNTER — Encounter (HOSPITAL_COMMUNITY): Payer: Self-pay | Admitting: *Deleted

## 2017-12-02 ENCOUNTER — Telehealth (HOSPITAL_COMMUNITY): Payer: Self-pay

## 2017-12-02 NOTE — Progress Notes (Signed)
Pt's family member called stating they needed [facility] and speak to Dr Joseph ArtWoods nurse regarding getting pt approval to receive feraheme at our facility.  She stated he had  VA and medicare but did not want to use the [facility] and needed permission to do so.  I called and spoke with Dr. Harriett RushWood's nurse who stated the pt was supposed to have an appt with them today and the family member was supposed to bring in proper documentation to them regarding the appt.  I called and left a message with Jenel Lucksavid Walker at the pre-service center asking for someone from their office to give the patient and or patient's family member a call regarding getting this approved so we can get them on our schedule.

## 2017-12-02 NOTE — Telephone Encounter (Signed)
attempted to confirm apt -- phone line "not available"

## 2017-12-03 ENCOUNTER — Encounter (HOSPITAL_COMMUNITY): Payer: Non-veteran care

## 2017-12-04 ENCOUNTER — Other Ambulatory Visit (HOSPITAL_COMMUNITY): Payer: Self-pay | Admitting: *Deleted

## 2017-12-05 ENCOUNTER — Ambulatory Visit (HOSPITAL_COMMUNITY)
Admission: RE | Admit: 2017-12-05 | Discharge: 2017-12-05 | Disposition: A | Discharge status: Home / Self Care | Payer: Medicare Other | Source: Ambulatory Visit | Attending: Nephrology | Admitting: Nephrology

## 2017-12-05 DIAGNOSIS — D631 Anemia in chronic kidney disease: Secondary | ICD-10-CM | POA: Insufficient documentation

## 2017-12-05 MED ORDER — SODIUM CHLORIDE 0.9 % IV SOLN
510.0000 mg | Freq: Once | INTRAVENOUS | Status: AC
  Administered 2017-12-05: 510 mg via INTRAVENOUS
  Filled 2017-12-05: qty 17

## 2017-12-12 ENCOUNTER — Encounter (HOSPITAL_COMMUNITY): Payer: Non-veteran care

## 2018-01-01 ENCOUNTER — Other Ambulatory Visit: Payer: Self-pay

## 2018-01-01 DIAGNOSIS — N179 Acute kidney failure, unspecified: Secondary | ICD-10-CM

## 2018-01-01 DIAGNOSIS — Z01810 Encounter for preprocedural cardiovascular examination: Secondary | ICD-10-CM

## 2018-01-01 DIAGNOSIS — N185 Chronic kidney disease, stage 5: Secondary | ICD-10-CM

## 2018-01-13 ENCOUNTER — Encounter (HOSPITAL_COMMUNITY): Payer: Non-veteran care

## 2018-01-13 ENCOUNTER — Encounter: Payer: Non-veteran care | Admitting: Vascular Surgery

## 2018-01-13 ENCOUNTER — Other Ambulatory Visit (HOSPITAL_COMMUNITY): Payer: Non-veteran care

## 2018-06-22 DEATH — deceased

## 2019-07-09 IMAGING — CR DG CHEST 2V
2 series · 2 of 2 positions shown · non-contrast
Comparison: 12/02/2015

CLINICAL DATA: Shortness of breath following fall several weeks ago
with pain, initial encounter

EXAM:
CHEST  2 VIEW

[w chest pa]
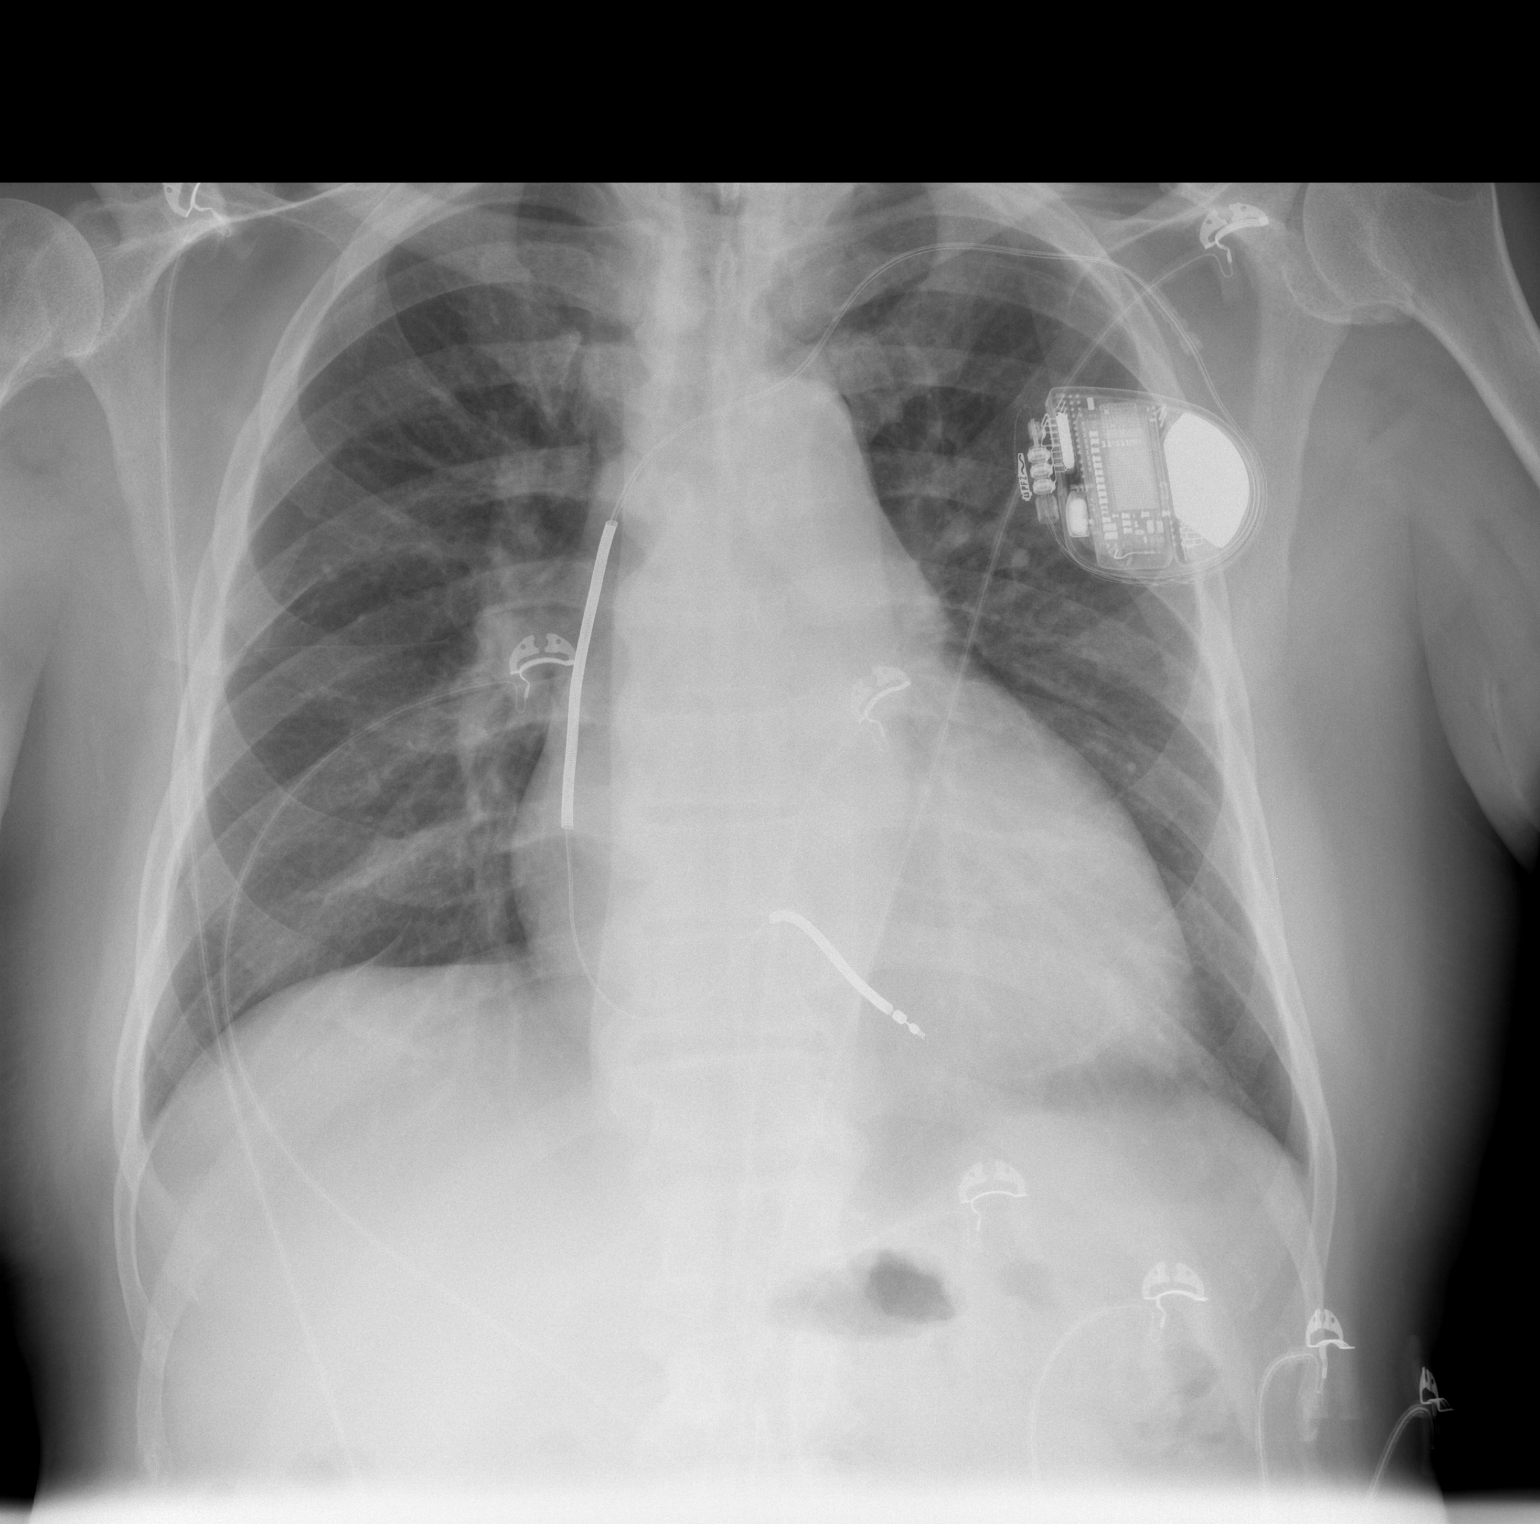

[w chest lat]
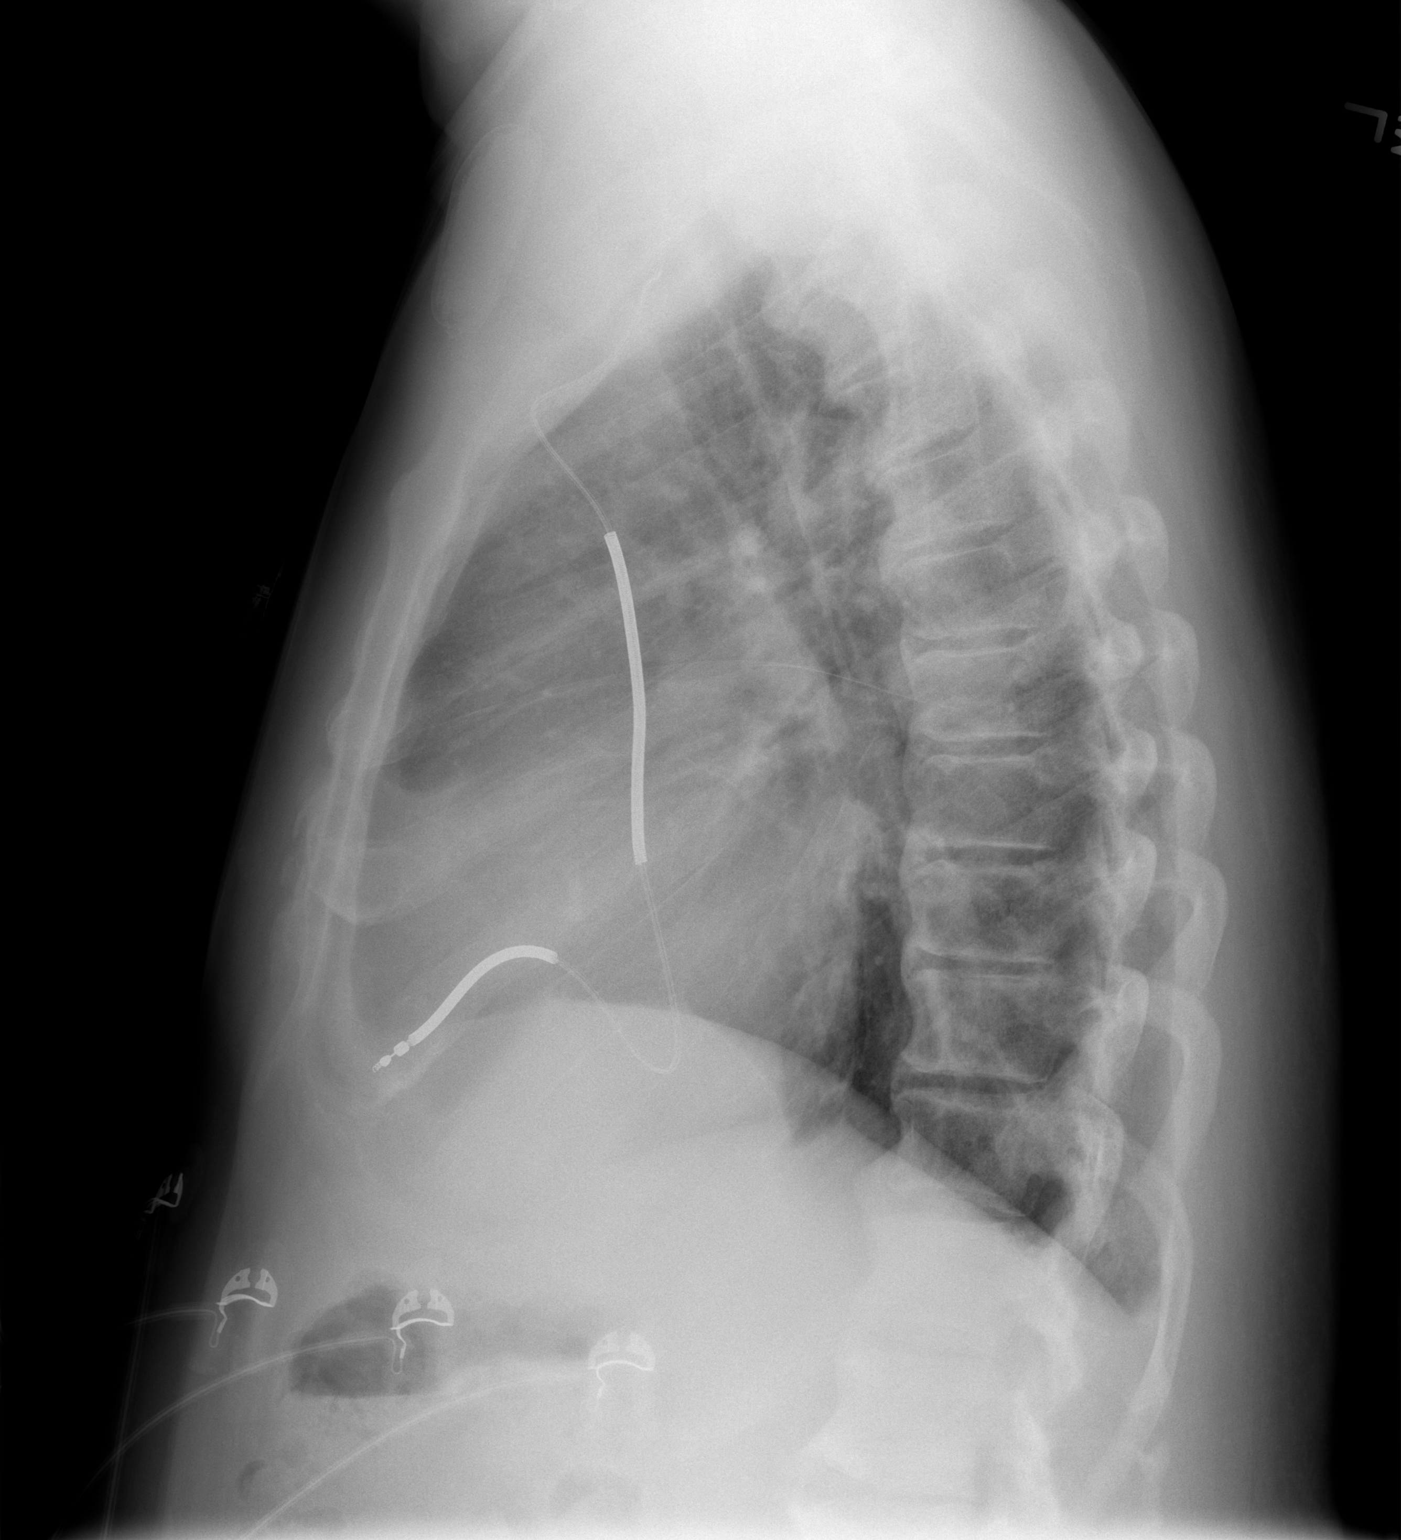

[2 of 2 positions shown; findings below may reference images not displayed]

FINDINGS: Cardiac shadow is mildly enlarged but stable. Defibrillator is now
seen in satisfactory position. The lungs are well aerated
bilaterally. No focal infiltrate or sizable effusion is seen. No
definitive rib abnormality is seen. Degenerative change of the
thoracic spine is noted.
IMPRESSION: No active cardiopulmonary disease.
